# Patient Record
Sex: Female | Born: 1980 | Race: White | Hispanic: No | Marital: Married | Smoking: Never smoker
Health system: Southern US, Community
[De-identification: ages and names within clinical notes are randomized; demographics above are authoritative.]

## PROBLEM LIST (undated history)

## (undated) DIAGNOSIS — O24419 Gestational diabetes mellitus in pregnancy, unspecified control: Secondary | ICD-10-CM

## (undated) DIAGNOSIS — Z8742 Personal history of other diseases of the female genital tract: Secondary | ICD-10-CM

## (undated) DIAGNOSIS — D649 Anemia, unspecified: Secondary | ICD-10-CM

## (undated) HISTORY — DX: Personal history of other diseases of the female genital tract: Z87.42

## (undated) HISTORY — DX: Anemia, unspecified: D64.9

## (undated) HISTORY — DX: Gestational diabetes mellitus in pregnancy, unspecified control: O24.419

---

## 2007-12-28 ENCOUNTER — Emergency Department: Payer: Self-pay | Admitting: Emergency Medicine

## 2007-12-28 ENCOUNTER — Other Ambulatory Visit: Payer: Self-pay

## 2008-01-21 ENCOUNTER — Ambulatory Visit: Payer: Self-pay | Admitting: Family Medicine

## 2008-10-26 ENCOUNTER — Ambulatory Visit: Payer: Self-pay | Admitting: Family Medicine

## 2009-06-01 IMAGING — CT CT ABD-PELV W/ CM
1 of 2 series · 15 of 32 positions shown, 19 images · non-contrast
Comparison: none

REASON FOR EXAM: (1) RLQ pain; (2) RLQ pain
COMMENTS:

PROCEDURE:     CT  - CT ABDOMEN / PELVIS  W  - December 29, 2007  [DATE]
RESULT:     Comparison: No available comparison exam.
TECHNIQUE: CT examination of the abdomen and pelvis was performed after
intravenous administration of 100 ml of Nsovue-G74 nonionic contrast in
addition to oral contrast. Collimation is 8 mm. Reformats with collimation
of 2 mm were reviewed on the work station.

[Series 2: abdomen · axial · 0.68mm/px · z∈[-96,+272]mm · 15 of 52 slices shown, 19 images]
[im 3/52  soft-tissue]
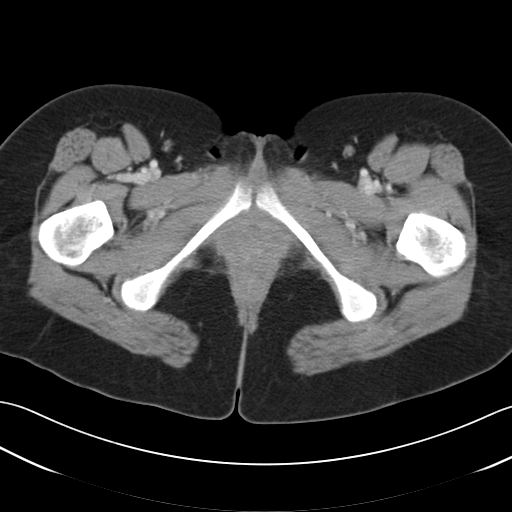
[im 3/52  bone]
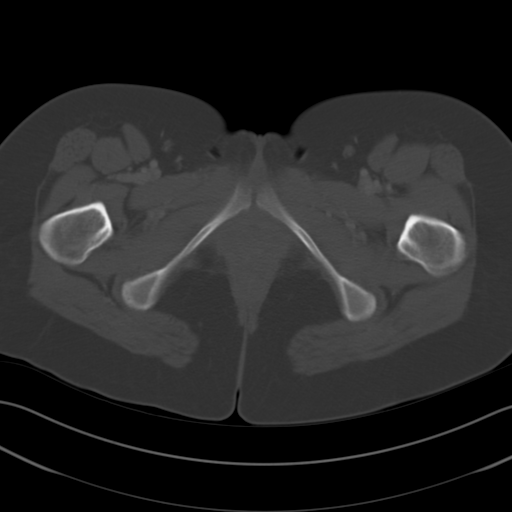
[im 7/52  soft-tissue]
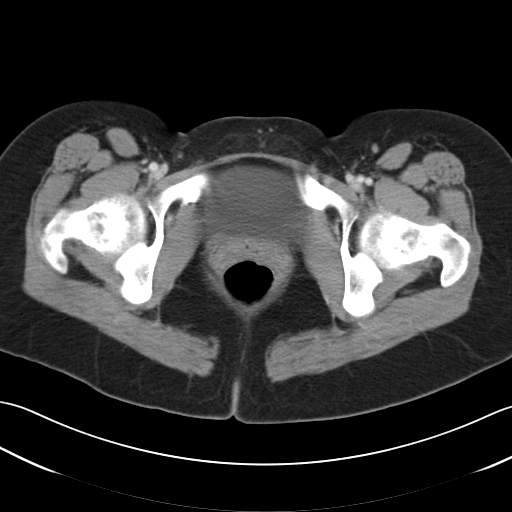
[im 11/52  soft-tissue]
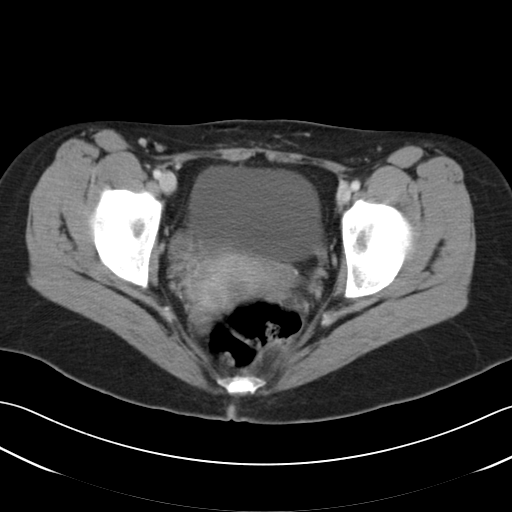
[im 15/52  soft-tissue]
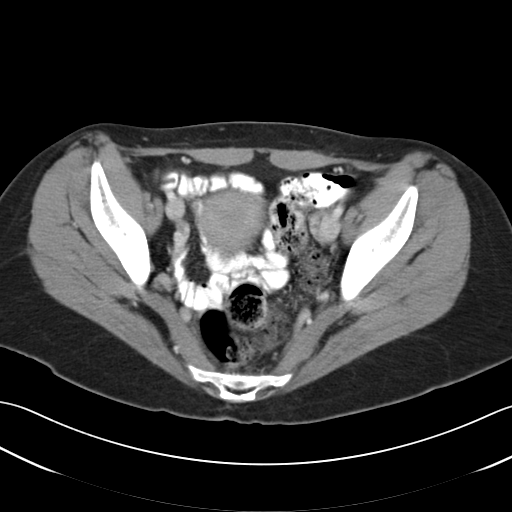
[im 18/52  soft-tissue]
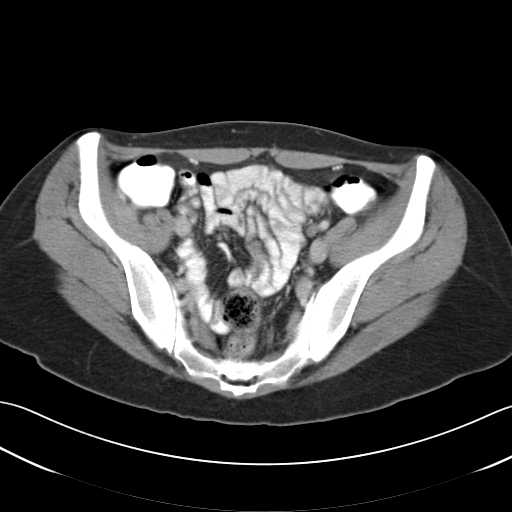
[im 22/52  soft-tissue]
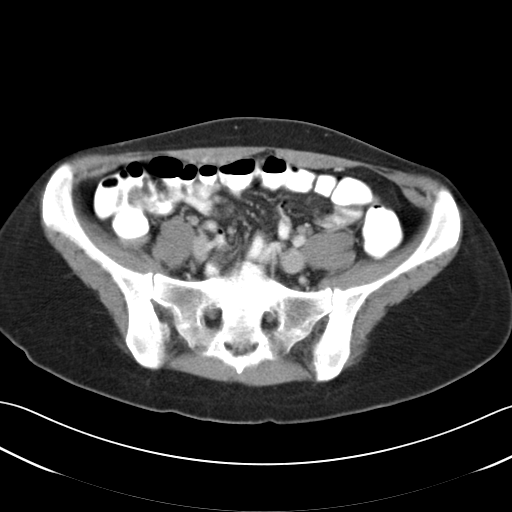
[im 26/52  soft-tissue]
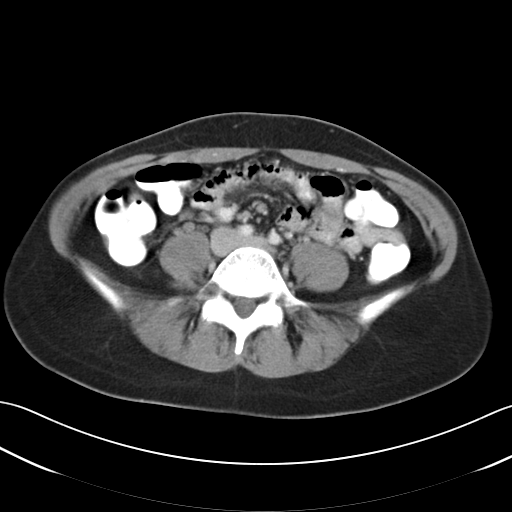
[im 30/52  soft-tissue]
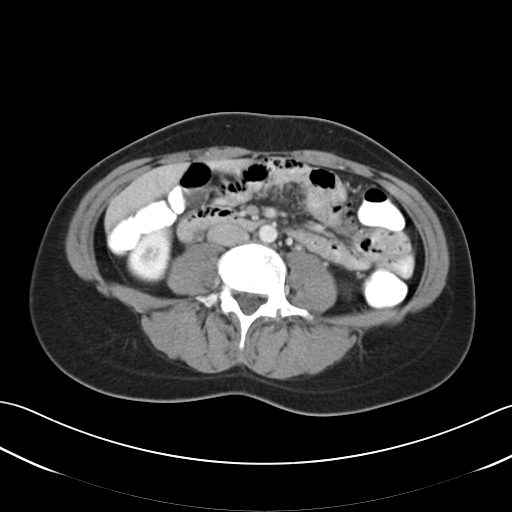
[im 35/52  soft-tissue]
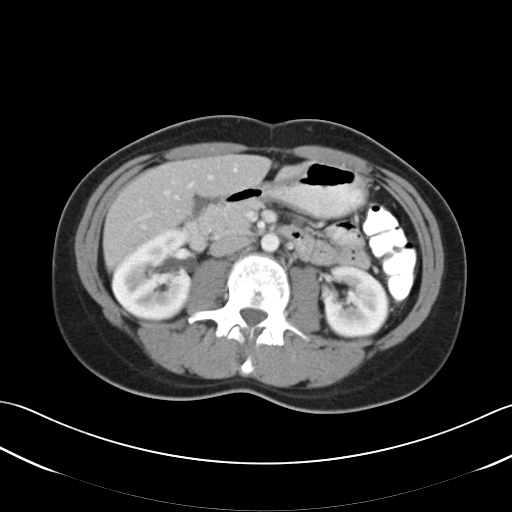
[im 35/52  bone]
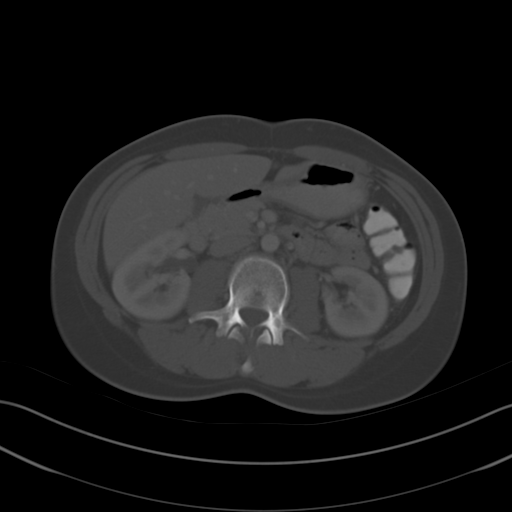
[im 37/52  soft-tissue]
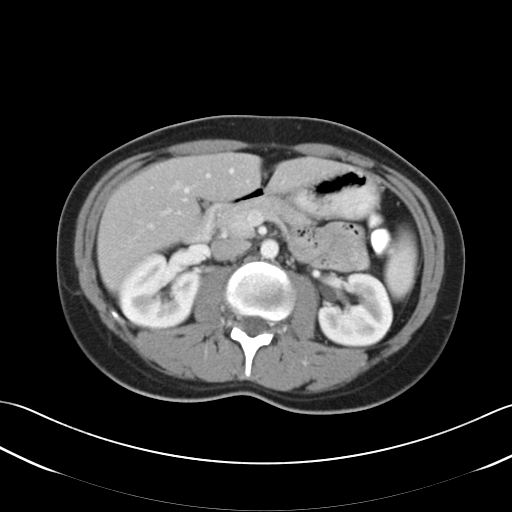
[im 41/52  soft-tissue]
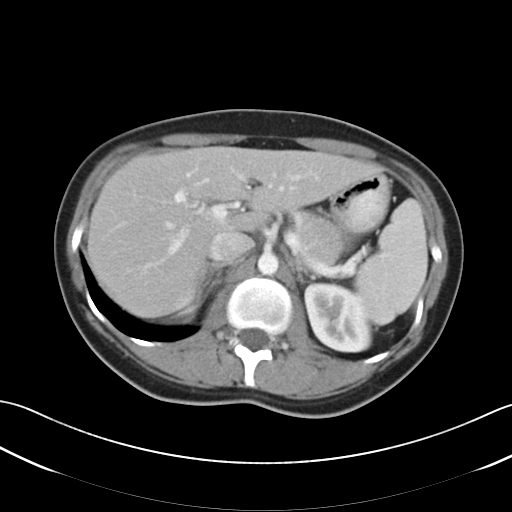
[im 43/52  lung]
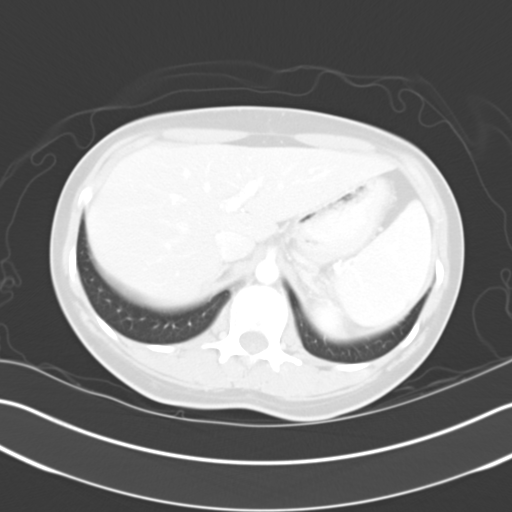
[im 45/52  soft-tissue]
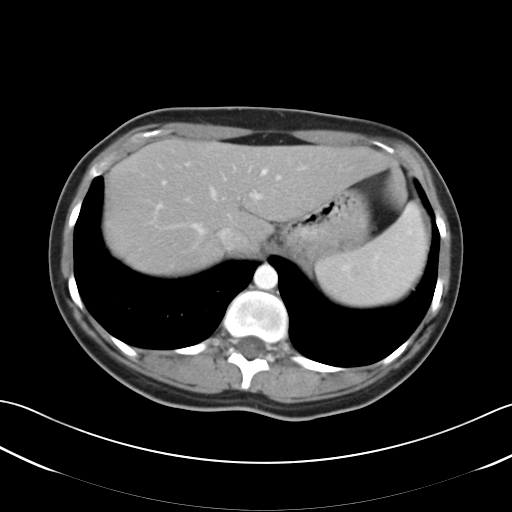
[im 45/52  lung]
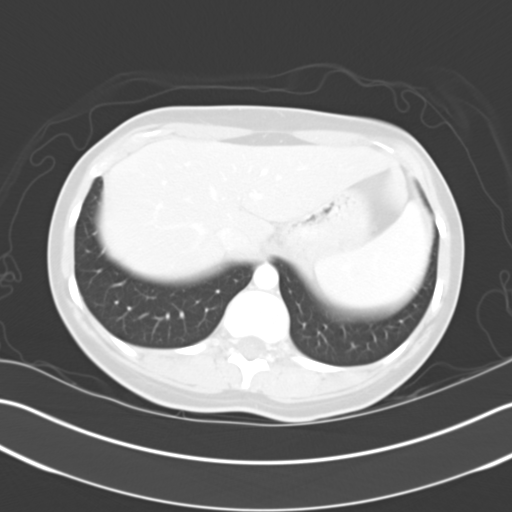
[im 47/52  lung]
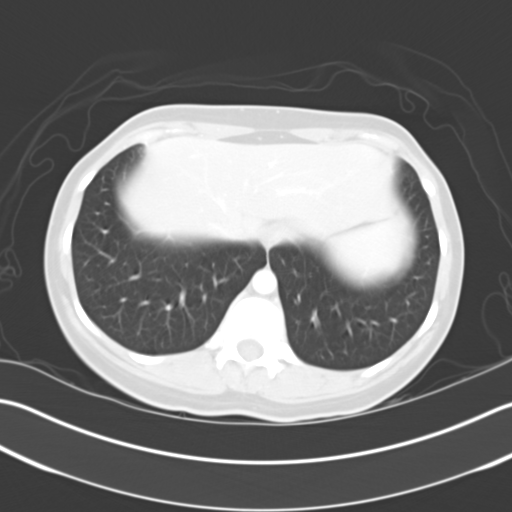
[im 49/52  soft-tissue]
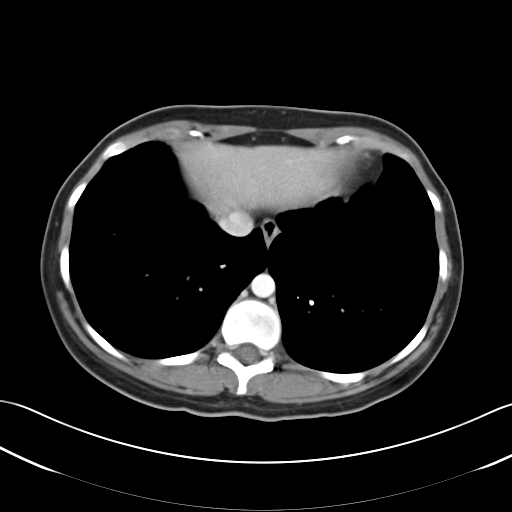
[im 49/52  lung]
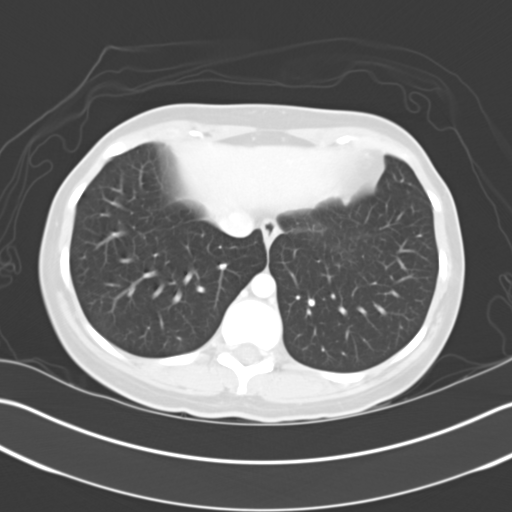

[15 of 32 positions shown; findings below may reference images not displayed]

FINDINGS: Limited evaluation of the lung bases is unremarkable.

The liver, gallbladder, spleen, pancreas, adrenal glands and kidneys are
unremarkable.

There is no dilatation or definite wall thickening of the bowels. The
appendix is unremarkable. There is no significant intra abdominal/pelvic fat
stranding. There is no intraperitoneal free air. There is no significant
free fluid. There are no enlarged abdominal pelvic lymph nodes. The uterus
and ovaries are present.
IMPRESSION: 1. The appendix is unremarkable. There is no significant intra-abdominal or
pelvic fat stranding. There is no bowel obstruction.

Preliminary report was faxed to the emergency room by the night radiologist
shortly after the study was performed.

## 2010-03-30 IMAGING — US ABDOMEN ULTRASOUND
1 series · 17 of 25 positions shown · non-contrast
Comparison: none

REASON FOR EXAM: abdominal pain
COMMENTS:

[Series 1: abdomen ultrasound · 17 of 51 slices shown]
[im 1/51]
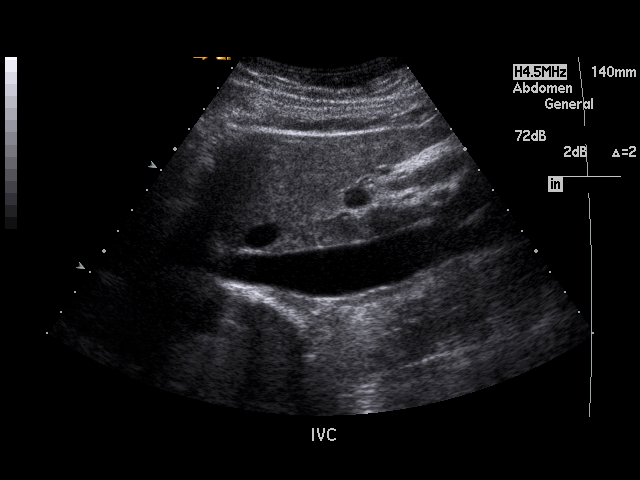
[im 5/51]
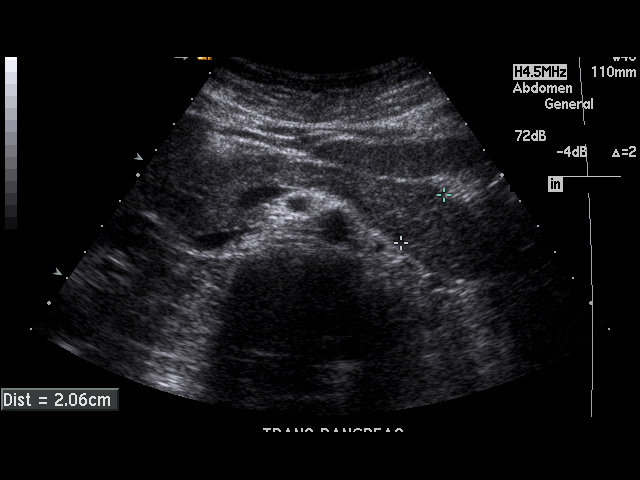
[im 7/51]
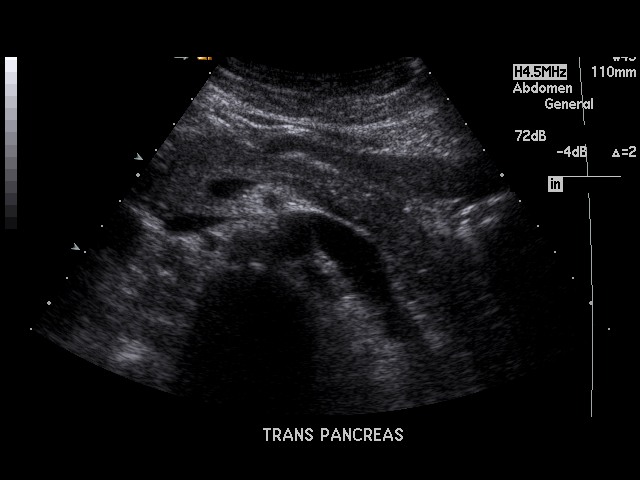
[im 11/51]
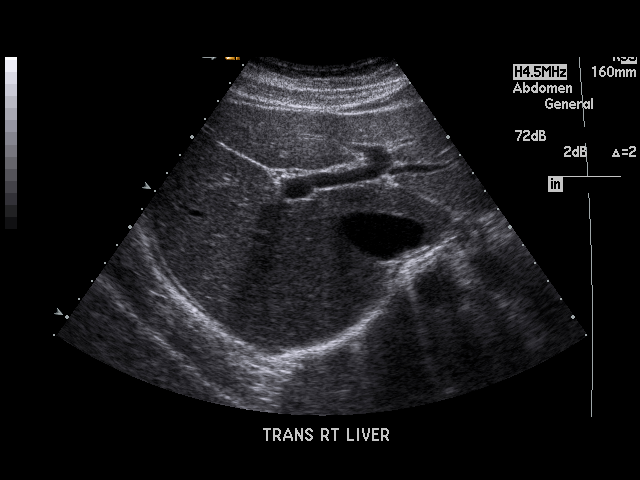
[im 13/51]
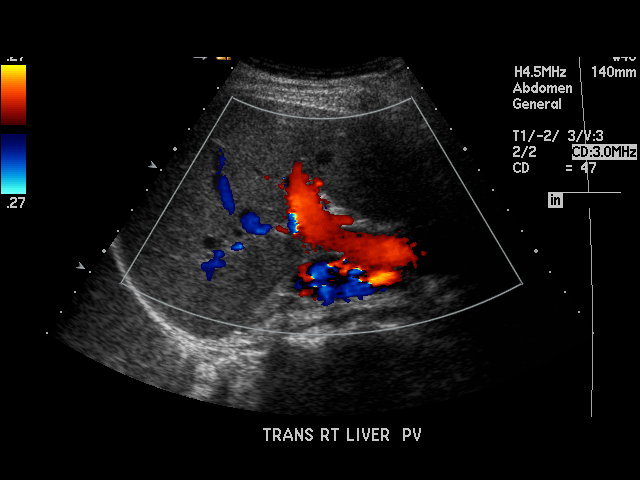
[im 17/51]
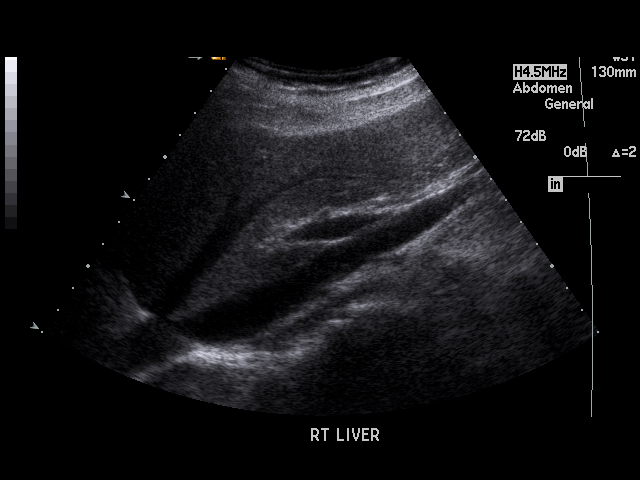
[im 19/51]
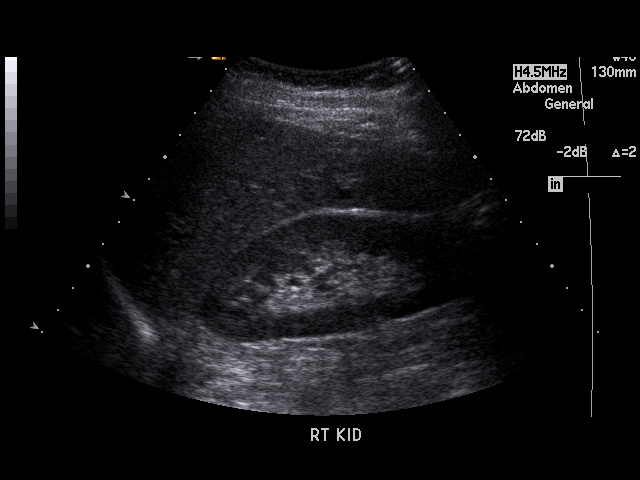
[im 23/51]
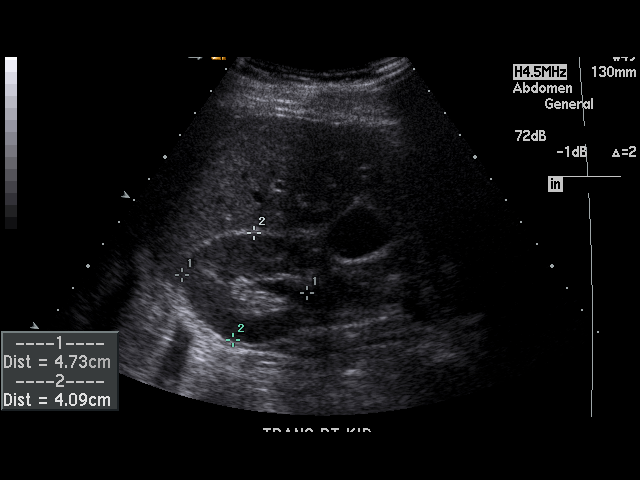
[im 26/51]
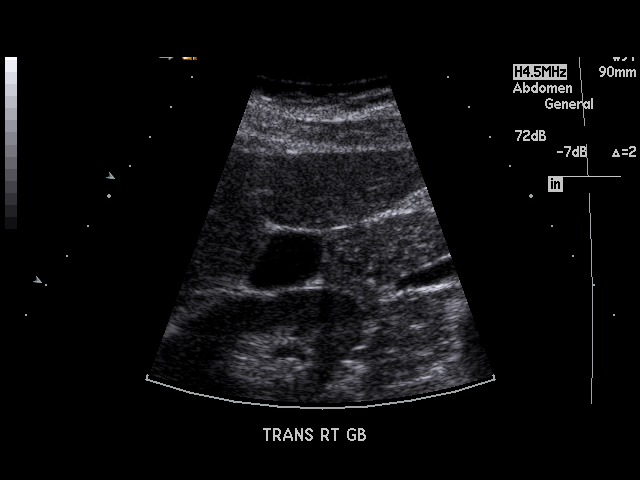
[im 28/51]
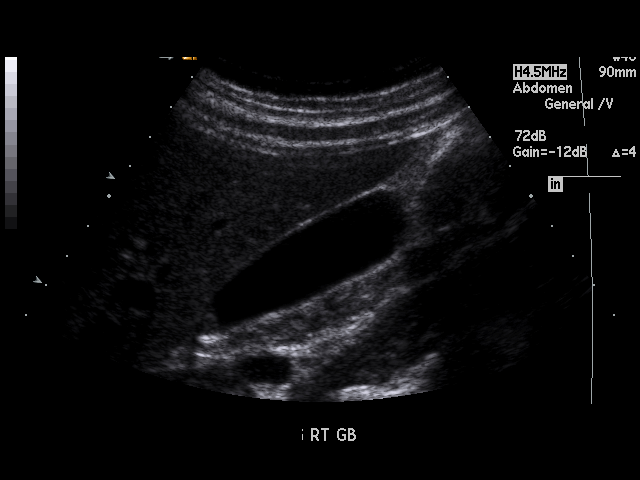
[im 32/51]
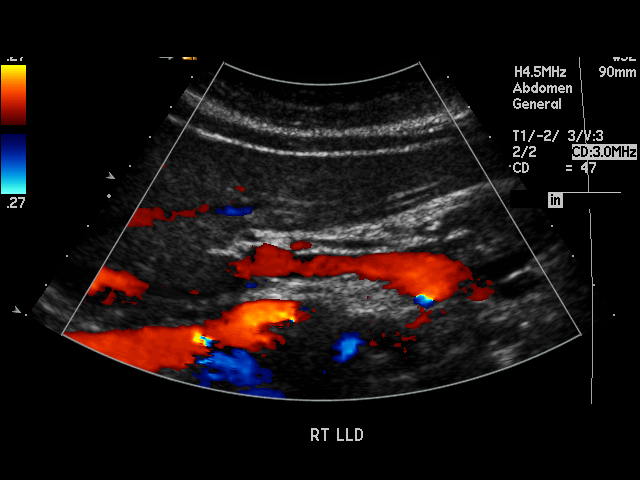
[im 34/51]
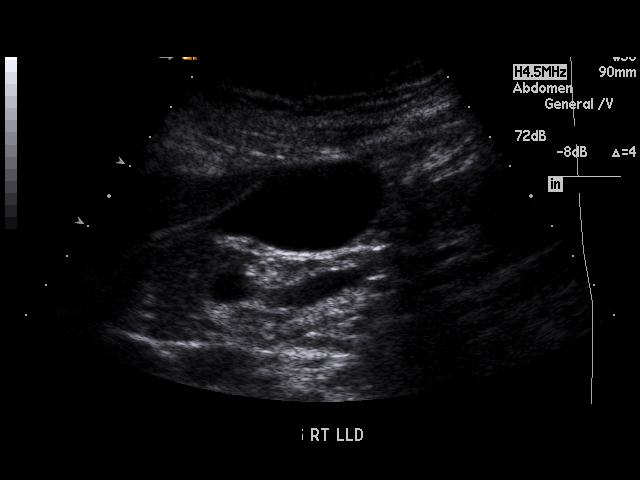
[im 38/51]
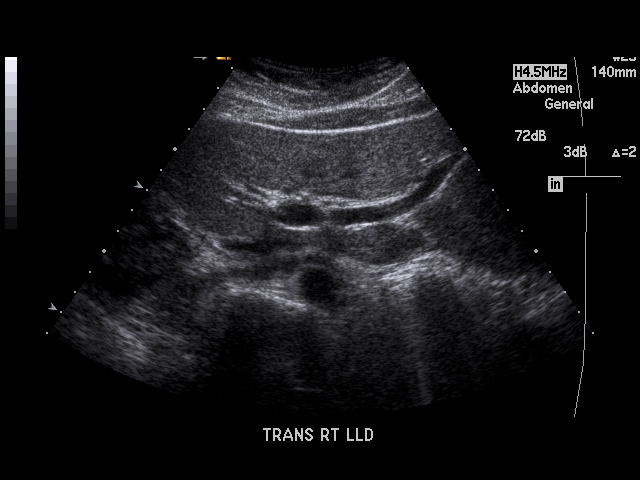
[im 40/51]
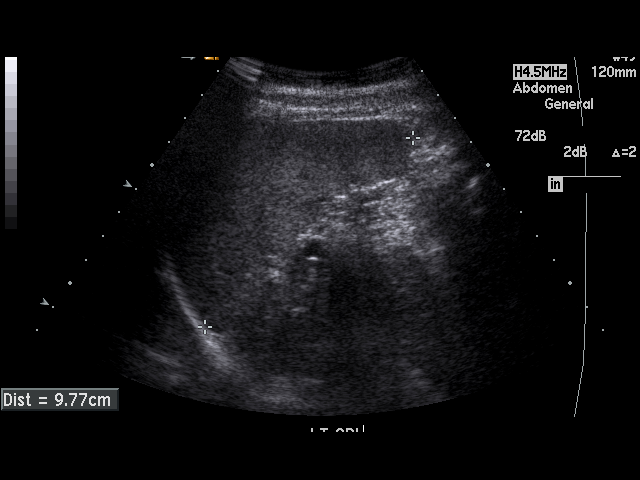
[im 44/51]
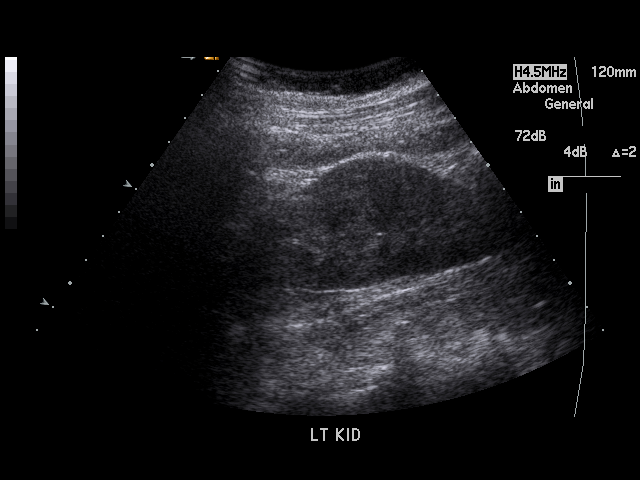
[im 46/51]
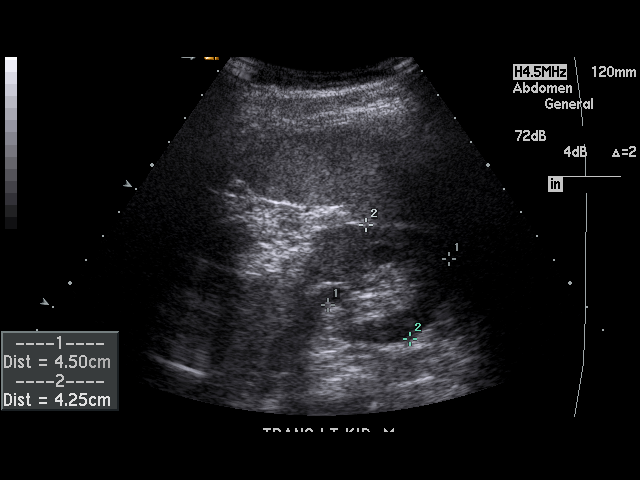
[im 51/51]
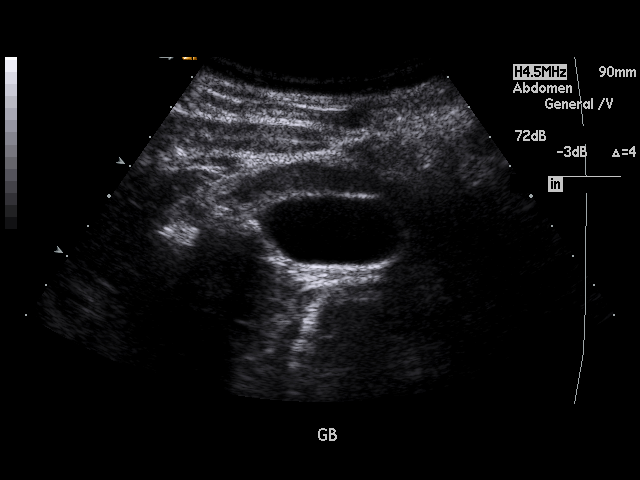

[17 of 25 positions shown; findings below may reference images not displayed]

PROCEDURE:     US  - US ABDOMEN GENERAL SURVEY  - October 26, 2008  [DATE]

RESULT:     The liver exhibits normal echotexture with no focal mass nor
ductal dilation. Portal venous flow is normal in direction toward the liver.
The gallbladder is adequately distended with no evidence of stones, wall
thickening, or pericholecystic fluid. There was no sonographic Murphy's
sign. The common bile duct measures 2.8 mm in diameter. The pancreas,
spleen, abdominal aorta, and kidneys are normal in appearance. There is no
evidence of ascites.
IMPRESSION: Normal abdominal ultrasound examination.

## 2016-07-08 ENCOUNTER — Ambulatory Visit (INDEPENDENT_AMBULATORY_CARE_PROVIDER_SITE_OTHER): Payer: Self-pay | Admitting: Obstetrics and Gynecology

## 2016-07-08 VITALS — BP 117/81 | HR 64 | Ht 65.0 in | Wt 154.4 lb

## 2016-07-08 DIAGNOSIS — Z113 Encounter for screening for infections with a predominantly sexual mode of transmission: Secondary | ICD-10-CM

## 2016-07-08 DIAGNOSIS — O09891 Supervision of other high risk pregnancies, first trimester: Secondary | ICD-10-CM

## 2016-07-08 DIAGNOSIS — Z331 Pregnant state, incidental: Secondary | ICD-10-CM

## 2016-07-08 DIAGNOSIS — J349 Unspecified disorder of nose and nasal sinuses: Secondary | ICD-10-CM | POA: Insufficient documentation

## 2016-07-08 DIAGNOSIS — Z1389 Encounter for screening for other disorder: Secondary | ICD-10-CM

## 2016-07-08 DIAGNOSIS — Z369 Encounter for antenatal screening, unspecified: Secondary | ICD-10-CM

## 2016-07-08 NOTE — Patient Instructions (Signed)
Pregnancy and Zika Virus Disease Zika virus disease, or Zika, is an illness that can spread to people from mosquitoes that carry the virus. It may also spread from person to person through infected body fluids. Zika first occurred in [place], but recently it has spread to new areas. The virus occurs in tropical climates. The location of Zika continues to change. Most people who become infected with Zika virus do not develop serious illness. However, Zika may cause birth defects in an unborn baby whose mother is infected with the virus. It may also increase the risk of miscarriage. WHAT ARE THE SYMPTOMS OF ZIKA VIRUS DISEASE? In many cases, people who have been infected with Zika virus do not develop any symptoms. If symptoms appear, they usually start about a week after the person is infected. Symptoms are usually mild. They may include:  Fever.  Rash.  Red eyes.  Joint pain. HOW DOES ZIKA VIRUS DISEASE SPREAD? The main way that Zika virus spreads is through the bite of a certain type of mosquito. Unlike most types of mosquitos, which bite only at night, the type of mosquito that carries Zika virus bites both at night and during the day. Zika virus can also spread through sexual contact, through a blood transfusion, and from a mother to her baby before or during birth. Once you have had Zika virus disease, it is unlikely that you will get it again. CAN I PASS ZIKA TO MY BABY DURING PREGNANCY? Yes, Zika can pass from a mother to her baby before or during birth. WHAT PROBLEMS CAN ZIKA CAUSE FOR MY BABY? A woman who is infected with Zika virus while pregnant is at risk of having her baby born with a condition in which the brain or head is smaller than expected (microcephaly). Babies who have microcephaly can have developmental delays, seizures, hearing problems, and vision problems. Having Zika virus disease during pregnancy can also increase the risk of miscarriage. HOW CAN ZIKA VIRUS DISEASE BE  PREVENTED? There is no vaccine to prevent Zika. The best way to prevent the disease is to avoid infected mosquitoes and avoid exposure to body fluids that can spread the virus. Avoid any possible exposure to Zika by taking the following precautions. For women and their sex partners:  Avoid traveling to high-risk areas. The locations where Zika is being reported change often. To identify high-risk areas, check the CDC travel website: www.cdc.gov/zika/geo/index.html  If you or your sex partner must travel to a high-risk area, talk with a health care provider before and after traveling.  Take all precautions to avoid mosquito bites if you live in, or travel to, any of the high-risk areas. Insect repellents are safe to use during pregnancy.  Ask your health care provider when it is safe to have sexual contact. For women:  If you are pregnant or trying to become pregnant, avoid sexual contact with persons who may have been exposed to Zika virus, persons who have possible symptoms of Zika, or persons whose history you are unsure about. If you choose to have sexual contact with someone who may have been exposed to Zika virus, use condoms correctly during the entire duration of sexual activity, every time. Do not share sexual devices, as you may be exposed to body fluids.  Ask your health care provider about when it is safe to attempt pregnancy after a possible exposure to Zika virus. WHAT STEPS SHOULD I TAKE TO AVOID MOSQUITO BITES? Take these steps to avoid mosquito bites when you are   in a high-risk area:  Wear loose clothing that covers your arms and legs.  Limit your outdoor activities.  Do not open windows unless they have window screens.  Sleep under mosquito nets.  Use insect repellent. The best insect repellents have:  DEET, picaridin, oil of lemon eucalyptus (OLE), or IR3535 in them.  Higher amounts of an active ingredient in them.  Remember that insect repellents are safe to use  during pregnancy.  Do not use OLE on children who are younger than 3 years of age. Do not use insect repellent on babies who are younger than 2 months of age.  Cover your child's stroller with mosquito netting. Make sure the netting fits snugly and that any loose netting does not cover your child's mouth or nose. Do not use a blanket as a mosquito-protection cover.  Do not apply insect repellent underneath clothing.  If you are using sunscreen, apply the sunscreen before applying the insect repellent.  Treat clothing with permethrin. Do not apply permethrin directly to your skin. Follow label directions for safe use.  Get rid of standing water, where mosquitoes may reproduce. Standing water is often found in items such as buckets, bowls, animal food dishes, and flowerpots. When you return from traveling to any high-risk area, continue taking actions to protect yourself against mosquito bites for 3 weeks, even if you show no signs of illness. This will prevent spreading Zika virus to uninfected mosquitoes. WHAT SHOULD I KNOW ABOUT THE SEXUAL TRANSMISSION OF ZIKA? People can spread Zika to their sexual partners during vaginal, anal, or oral sex, or by sharing sexual devices. Many people with Zika do not develop symptoms, so a person could spread the disease without knowing that they are infected. The greatest risk is to women who are pregnant or who may become pregnant. Zika virus can live longer in semen than it can live in blood. Couples can prevent sexual transmission of the virus by:  Using condoms correctly during the entire duration of sexual activity, every time. This includes vaginal, anal, and oral sex.  Not sharing sexual devices. Sharing increases your risk of being exposed to body fluid from another person.  Avoiding all sexual activity until your health care provider says it is safe. SHOULD I BE TESTED FOR ZIKA VIRUS? A sample of your blood can be tested for Zika virus. A pregnant  woman should be tested if she may have been exposed to the virus or if she has symptoms of Zika. She may also have additional tests done during her pregnancy, such ultrasound testing. Talk with your health care provider about which tests are recommended.   This information is not intended to replace advice given to you by your health care provider. Make sure you discuss any questions you have with your health care provider.   Document Released: 06/07/2015 Document Reviewed: 05/31/2015 Elsevier Interactive Patient Education 2016 [employer] Minor Illnesses and Medications in Pregnancy  Cold/Flu:  Sudafed for congestion- Robitussin (plain) for cough- Tylenol for discomfort.  Please follow the directions on the label.  Try not to take any more than needed.  OTC Saline nasal spray and air humidifier or cool-mist  Vaporizer to sooth nasal irritation and to loosen congestion.  It is also important to increase intake of non carbonated fluids, especially if you have a fever.  Constipation:  Colace-2 capsules at bedtime; Metamucil- follow directions on label; Senokot- 1 tablet at bedtime.  Any one of these medications can be used.  It is also   very important to increase fluids and fruits along with regular exercise.  If problem persists please call the office.  Diarrhea:  Kaopectate as directed on the label.  Eat a bland diet and increase fluids.  Avoid highly seasoned foods.  Headache:  Tylenol 1 or 2 tablets every 3-4 hours as needed  Indigestion:  Maalox, Mylanta, Tums or Rolaids- as directed on label.  Also try to eat small meals and avoid fatty, greasy or spicy foods.  Nausea with or without Vomiting:  Nausea in pregnancy is caused by increased levels of hormones in the body which influence the digestive system and cause irritation when stomach acids accumulate.  Symptoms usually subside after 1st trimester of pregnancy.  Try the following: 1. Keep saltines, graham crackers or dry toast by your bed  to eat upon awakening. 2. Don't let your stomach get empty.  Try to eat 5-6 small meals per day instead of 3 large ones. 3. Avoid greasy fatty or highly seasoned foods.  4. Take OTC Unisom 1 tablet at bed time along with OTC Vitamin B6 25-50 mg 3 times per day.    If nausea continues with vomiting and you are unable to keep down food and fluids you may need a prescription medication.  Please notify your provider.   Sore throat:  Chloraseptic spray, throat lozenges and or plain Tylenol.  Vaginal Yeast Infection:  OTC Monistat for 7 days as directed on label.  If symptoms do not resolve within a week notify provider.  If any of the above problems do not subside with recommended treatment please call the office for further assistance.   Do not take Aspirin, Advil, Motrin or Ibuprofen.  * * OTC= Over the counter Hyperemesis Gravidarum Hyperemesis gravidarum is a severe form of nausea and vomiting that happens during pregnancy. Hyperemesis is worse than morning sickness. It may cause you to have nausea or vomiting all day for many days. It may keep you from eating and drinking enough food and liquids. Hyperemesis usually occurs during the first half (the first 20 weeks) of pregnancy. It often goes away once a woman is in her second half of pregnancy. However, sometimes hyperemesis continues through an entire pregnancy.  CAUSES  The cause of this condition is not completely known but is thought to be related to changes in the body's hormones when pregnant. It could be from the high level of the pregnancy hormone or an increase in estrogen in the body.  SIGNS AND SYMPTOMS   Severe nausea and vomiting.  Nausea that does not go away.  Vomiting that does not allow you to keep any food down.  Weight loss and body fluid loss (dehydration).  Having no desire to eat or not liking food you have previously enjoyed. DIAGNOSIS  Your health care provider will do a physical exam and ask you about your  symptoms. He or she may also order blood tests and urine tests to make sure something else is not causing the problem.  TREATMENT  You may only need medicine to control the problem. If medicines do not control the nausea and vomiting, you will be treated in the hospital to prevent dehydration, increased acid in the blood (acidosis), weight loss, and changes in the electrolytes in your body that may harm the unborn baby (fetus). You may need IV fluids.  HOME CARE INSTRUCTIONS   Only take over-the-counter or prescription medicines as directed by your health care provider.  Try eating a couple of dry crackers or   toast in the morning before getting out of bed.  Avoid foods and smells that upset your stomach.  Avoid fatty and spicy foods.  Eat 5-6 small meals a day.  Do not drink when eating meals. Drink between meals.  For snacks, eat high-protein foods, such as cheese.  Eat or suck on things that have ginger in them. Ginger helps nausea.  Avoid food preparation. The smell of food can spoil your appetite.  Avoid iron pills and iron in your multivitamins until after 3-4 months of being pregnant. However, consult with your health care provider before stopping any prescribed iron pills. SEEK MEDICAL CARE IF:   Your abdominal pain increases.  You have a severe headache.  You have vision problems.  You are losing weight. SEEK IMMEDIATE MEDICAL CARE IF:   You are unable to keep fluids down.  You vomit blood.  You have constant nausea and vomiting.  You have excessive weakness.  You have extreme thirst.  You have dizziness or fainting.  You have a fever or persistent symptoms for more than 2-3 days.  You have a fever and your symptoms suddenly get worse. MAKE SURE YOU:   Understand these instructions.  Will watch your condition.  Will get help right away if you are not doing well or get worse.   This information is not intended to replace advice given to you by your  health care provider. Make sure you discuss any questions you have with your health care provider.   Document Released: 09/16/2005 Document Revised: 07/07/2013 Document Reviewed: 04/28/2013 Elsevier Interactive Patient Education 2016 [employer] Commonly Asked Questions During Pregnancy  Cats: A parasite can be excreted in cat feces.  To avoid exposure you need to have another person empty the little box.  If you must empty the litter box you will need to wear gloves.  Wash your hands after handling your cat.  This parasite can also be found in raw or undercooked meat so this should also be avoided.  Colds, Sore Throats, Flu: Please check your medication sheet to see what you can take for symptoms.  If your symptoms are unrelieved by these medications please call the office.  Dental Work: Most any dental work your dentist recommends is permitted.  X-rays should only be taken during the first trimester if absolutely necessary.  Your abdomen should be shielded with a lead apron during all x-rays.  Please notify your provider prior to receiving any x-rays.  Novocaine is fine; gas is not recommended.  If your dentist requires a note from us prior to dental work please call the office and we will provide one for you.  Exercise: Exercise is an important part of staying healthy during your pregnancy.  You may continue most exercises you were accustomed to prior to pregnancy.  Later in your pregnancy you will most likely notice you have difficulty with activities requiring balance like riding a bicycle.  It is important that you listen to your body and avoid activities that put you at a higher risk of falling.  Adequate rest and staying well hydrated are a must!  If you have questions about the safety of specific activities ask your provider.    Exposure to Children with illness: Try to avoid obvious exposure; report any symptoms to us when noted,  If you have chicken pos, red measles or mumps, you should  be immune to these diseases.   Please do not take any vaccines while pregnant unless you have checked with   your OB provider.  Fetal Movement: After 28 weeks we recommend you do "kick counts" twice daily.  Lie or sit down in a calm quiet environment and count your baby movements "kicks".  You should feel your baby at least 10 times per hour.  If you have not felt 10 kicks within the first hour get up, walk around and have something sweet to eat or drink then repeat for an additional hour.  If count remains less than 10 per hour notify your provider.  Fumigating: Follow your pest control agent's advice as to how long to stay out of your home.  Ventilate the area well before re-entering.  Hemorrhoids:   Most over-the-counter preparations can be used during pregnancy.  Check your medication to see what is safe to use.  It is important to use a stool softener or fiber in your diet and to drink lots of liquids.  If hemorrhoids seem to be getting worse please call the office.   Hot Tubs:  Hot tubs Jacuzzis and saunas are not recommended while pregnant.  These increase your internal body temperature and should be avoided.  Intercourse:  Sexual intercourse is safe during pregnancy as long as you are comfortable, unless otherwise advised by your provider.  Spotting may occur after intercourse; report any bright red bleeding that is heavier than spotting.  Labor:  If you know that you are in labor, please go to the hospital.  If you are unsure, please call the office and let us help you decide what to do.  Lifting, straining, etc:  If your job requires heavy lifting or straining please check with your provider for any limitations.  Generally, you should not lift items heavier than that you can lift simply with your hands and arms (no back muscles)  Painting:  Paint fumes do not harm your pregnancy, but may make you ill and should be avoided if possible.  Latex or water based paints have less odor than oils.   Use adequate ventilation while painting.  Permanents & Hair Color:  Chemicals in hair dyes are not recommended as they cause increase hair dryness which can increase hair loss during pregnancy.  " Highlighting" and permanents are allowed.  Dye may be absorbed differently and permanents may not hold as well during pregnancy.  Sunbathing:  Use a sunscreen, as skin burns easily during pregnancy.  Drink plenty of fluids; avoid over heating.  Tanning Beds:  Because their possible side effects are still unknown, tanning beds are not recommended.  Ultrasound Scans:  Routine ultrasounds are performed at approximately 20 weeks.  You will be able to see your baby's general anatomy an if you would like to know the gender this can usually be determined as well.  If it is questionable when you conceived you may also receive an ultrasound early in your pregnancy for dating purposes.  Otherwise ultrasound exams are not routinely performed unless there is a medical necessity.  Although you can request a scan we ask that you pay for it when conducted because insurance does not cover " patient request" scans.  Work: If your pregnancy proceeds without complications you may work until your due date, unless your physician or employer advises otherwise.  Round Ligament Pain/Pelvic Discomfort:  Sharp, shooting pains not associated with bleeding are fairly common, usually occurring in the second trimester of pregnancy.  They tend to be worse when standing up or when you remain standing for long periods of time.  These are the result   of pressure of certain pelvic ligaments called "round ligaments".  Rest, Tylenol and heat seem to be the most effective relief.  As the womb and fetus grow, they rise out of the pelvis and the discomfort improves.  Please notify the office if your pain seems different than that described.  It may represent a more serious condition.   

## 2016-07-08 NOTE — Progress Notes (Signed)
Clovis FredricksonShana S Eischeid presents for NOB nurse interview visit. Pregnancy confirmation done at [facility] on 07/01/2016. LMP: 05/13/2016.  G-2 .  P-1001 . Pt had twin males in 2004 and also has history of twins on both sides of family. Ultrasound ordered for viability and dating.  Pregnancy education material explained and given.  No cats in the home. NOB labs ordered. HIV labs and Drug screen were explained optional and she did not decline. Drug screen ordered. PNV encouraged. Genetic screening, to discuss with provider. Pt. To follow up with provider in 4 weeks for NOB physical.  All questions answered.

## 2016-07-09 ENCOUNTER — Encounter: Payer: Self-pay | Admitting: Obstetrics and Gynecology

## 2016-07-09 ENCOUNTER — Ambulatory Visit (INDEPENDENT_AMBULATORY_CARE_PROVIDER_SITE_OTHER): Payer: Medicaid Other

## 2016-07-09 DIAGNOSIS — Z283 Underimmunization status: Secondary | ICD-10-CM | POA: Insufficient documentation

## 2016-07-09 DIAGNOSIS — Z3481 Encounter for supervision of other normal pregnancy, first trimester: Secondary | ICD-10-CM

## 2016-07-09 DIAGNOSIS — O09891 Supervision of other high risk pregnancies, first trimester: Secondary | ICD-10-CM

## 2016-07-09 DIAGNOSIS — O09899 Supervision of other high risk pregnancies, unspecified trimester: Secondary | ICD-10-CM | POA: Insufficient documentation

## 2016-07-09 DIAGNOSIS — Z369 Encounter for antenatal screening, unspecified: Secondary | ICD-10-CM

## 2016-07-09 DIAGNOSIS — O9989 Other specified diseases and conditions complicating pregnancy, childbirth and the puerperium: Secondary | ICD-10-CM

## 2016-07-09 LAB — URINALYSIS, ROUTINE W REFLEX MICROSCOPIC
BILIRUBIN UA: NEGATIVE
GLUCOSE, UA: NEGATIVE
KETONES UA: NEGATIVE
NITRITE UA: NEGATIVE
Protein, UA: NEGATIVE
RBC UA: NEGATIVE
SPEC GRAV UA: 1.014 (ref 1.005–1.030)
Urobilinogen, Ur: 0.2 mg/dL (ref 0.2–1.0)
pH, UA: 7.5 (ref 5.0–7.5)

## 2016-07-09 LAB — RUBELLA SCREEN

## 2016-07-09 LAB — MICROSCOPIC EXAMINATION: Casts: NONE SEEN /lpf

## 2016-07-09 LAB — MONITOR DRUG PROFILE 14(MW)
Amphetamine Scrn, Ur: NEGATIVE ng/mL
BARBITURATE SCREEN URINE: NEGATIVE ng/mL
BENZODIAZEPINE SCREEN, URINE: NEGATIVE ng/mL
Buprenorphine, Urine: NEGATIVE ng/mL
CANNABINOIDS UR QL SCN: NEGATIVE ng/mL
Cocaine (Metab) Scrn, Ur: NEGATIVE ng/mL
Creatinine(Crt), U: 71.7 mg/dL (ref 20.0–300.0)
Fentanyl, Urine: NEGATIVE pg/mL
Meperidine Screen, Urine: NEGATIVE ng/mL
Methadone Screen, Urine: NEGATIVE ng/mL
OXYCODONE+OXYMORPHONE UR QL SCN: NEGATIVE ng/mL
Opiate Scrn, Ur: NEGATIVE ng/mL
Ph of Urine: 7.1 (ref 4.5–8.9)
Phencyclidine Qn, Ur: NEGATIVE ng/mL
Propoxyphene Scrn, Ur: NEGATIVE ng/mL
SPECIFIC GRAVITY: 1.016
Tramadol Screen, Urine: NEGATIVE ng/mL

## 2016-07-09 LAB — ABO

## 2016-07-09 LAB — CBC WITH DIFFERENTIAL/PLATELET
BASOS: 0 %
Basophils Absolute: 0 10*3/uL (ref 0.0–0.2)
EOS (ABSOLUTE): 0.1 10*3/uL (ref 0.0–0.4)
EOS: 2 %
Hematocrit: 39.2 % (ref 34.0–46.6)
Hemoglobin: 13.1 g/dL (ref 11.1–15.9)
IMMATURE GRANS (ABS): 0 10*3/uL (ref 0.0–0.1)
IMMATURE GRANULOCYTES: 0 %
LYMPHS: 24 %
Lymphocytes Absolute: 1.9 10*3/uL (ref 0.7–3.1)
MCH: 28.8 pg (ref 26.6–33.0)
MCHC: 33.4 g/dL (ref 31.5–35.7)
MCV: 86 fL (ref 79–97)
MONOCYTES: 6 %
Monocytes Absolute: 0.5 10*3/uL (ref 0.1–0.9)
NEUTROS PCT: 68 %
Neutrophils Absolute: 5.5 10*3/uL (ref 1.4–7.0)
Platelets: 301 10*3/uL (ref 150–379)
RBC: 4.55 x10E6/uL (ref 3.77–5.28)
RDW: 13.7 % (ref 12.3–15.4)
WBC: 8.1 10*3/uL (ref 3.4–10.8)

## 2016-07-09 LAB — HEPATITIS B SURFACE ANTIGEN: Hepatitis B Surface Ag: NEGATIVE

## 2016-07-09 LAB — RH TYPE: Rh Factor: POSITIVE

## 2016-07-09 LAB — ANTIBODY SCREEN: Antibody Screen: NEGATIVE

## 2016-07-09 LAB — NICOTINE SCREEN, URINE: Cotinine Ql Scrn, Ur: NEGATIVE ng/mL

## 2016-07-09 LAB — HIV ANTIBODY (ROUTINE TESTING W REFLEX): HIV Screen 4th Generation wRfx: NONREACTIVE

## 2016-07-09 LAB — RPR: RPR Ser Ql: NONREACTIVE

## 2016-07-09 LAB — VARICELLA ZOSTER ANTIBODY, IGG: Varicella zoster IgG: 1339 index (ref >165)

## 2016-07-10 LAB — GC/CHLAMYDIA PROBE AMP
CHLAMYDIA, DNA PROBE: NEGATIVE
Neisseria gonorrhoeae by PCR: NEGATIVE

## 2016-07-10 LAB — URINE CULTURE, OB REFLEX: Organism ID, Bacteria: NO GROWTH

## 2016-07-10 LAB — CULTURE, OB URINE

## 2016-07-26 ENCOUNTER — Encounter: Payer: Self-pay | Admitting: Obstetrics and Gynecology

## 2016-08-06 ENCOUNTER — Encounter: Payer: Self-pay | Admitting: Obstetrics and Gynecology

## 2016-08-13 ENCOUNTER — Encounter: Payer: Self-pay | Admitting: Obstetrics and Gynecology

## 2016-08-13 ENCOUNTER — Ambulatory Visit (INDEPENDENT_AMBULATORY_CARE_PROVIDER_SITE_OTHER): Payer: Medicaid Other | Admitting: Obstetrics and Gynecology

## 2016-08-13 ENCOUNTER — Ambulatory Visit (INDEPENDENT_AMBULATORY_CARE_PROVIDER_SITE_OTHER): Payer: Medicaid Other

## 2016-08-13 VITALS — BP 105/68 | HR 81 | Wt 157.7 lb

## 2016-08-13 DIAGNOSIS — O0992 Supervision of high risk pregnancy, unspecified, second trimester: Secondary | ICD-10-CM

## 2016-08-13 DIAGNOSIS — Z1379 Encounter for other screening for genetic and chromosomal anomalies: Secondary | ICD-10-CM

## 2016-08-13 DIAGNOSIS — N83201 Unspecified ovarian cyst, right side: Secondary | ICD-10-CM | POA: Insufficient documentation

## 2016-08-13 DIAGNOSIS — O09212 Supervision of pregnancy with history of pre-term labor, second trimester: Secondary | ICD-10-CM

## 2016-08-13 DIAGNOSIS — N83202 Unspecified ovarian cyst, left side: Secondary | ICD-10-CM

## 2016-08-13 DIAGNOSIS — E663 Overweight: Secondary | ICD-10-CM

## 2016-08-13 DIAGNOSIS — O09892 Supervision of other high risk pregnancies, second trimester: Secondary | ICD-10-CM

## 2016-08-13 DIAGNOSIS — O34219 Maternal care for unspecified type scar from previous cesarean delivery: Secondary | ICD-10-CM

## 2016-08-13 LAB — POCT URINALYSIS DIPSTICK
Bilirubin, UA: NEGATIVE
GLUCOSE UA: NEGATIVE
Ketones, UA: NEGATIVE
NITRITE UA: NEGATIVE
Protein, UA: NEGATIVE
Spec Grav, UA: 1.025
UROBILINOGEN UA: NEGATIVE
pH, UA: 7

## 2016-08-13 NOTE — Progress Notes (Signed)
OBSTETRIC INITIAL PRENATAL VISIT  Subjective:    Alyssa Mcconnell is being seen today for her first obstetrical visit.  This is not a planned pregnancy. She is a [id number] female at 4971w1d gestation, Estimated Date of Delivery: 02/17/17 with Patient's last menstrual period was 05/13/2016 (exact date), consistent with 8 week sono. Her obstetrical history is significant for preterm twin delivery at 7832 weeks gestation and h/o prior C-section x 1.  Relationship with FOB: spouse, living together. Patient does intend to breast feed. Pregnancy history fully reviewed.    Obstetric History   G2   P1   T0   P1   A0   L1    SAB0   TAB0   Ectopic0   Multiple0   Live Births1     # Outcome Date GA Lbr Len/2nd Weight Sex Delivery Anes PTL Lv  2 Current           1 Preterm 01/31/03 7682w0d  3 lb (1.361 kg) M CS-Unspec  Y LIV    Obstetric Comments  2004 [facility] (emergency C/S) weight 3lbs, and 4lbs.    Gynecologic History:  Last pap smear was 2 years ago.  Results were normal.  Denies h/o abnormal pap smears in the past.  Denies history of STIs. Contraception: None   Past Medical History:  Diagnosis Date  . Anemia    during pregnancy  . History of ovarian cyst     Family History  Problem Relation Age of Onset  . Diabetes Father     Past Surgical History:  Procedure Laterality Date  . CESAREAN SECTION      Social History   Social History  . Marital status: Married    Spouse name: N/A  . Number of children: N/A  . Years of education: N/A   Occupational History  . Not on file.   Social History Main Topics  . Smoking status: Never Smoker  . Smokeless tobacco: Never Used  . Alcohol use No  . Drug use: No  . Sexual activity: Yes   Other Topics Concern  . Not on file   Social History Narrative  . No narrative on file    Current Outpatient Prescriptions on File Prior to Visit  Medication Sig Dispense Refill  . Prenatal Vit-Fe Fumarate-FA (PRENATAL VITAMINS) 28-0.8 MG TABS  Take by mouth.     No current facility-administered medications on file prior to visit.     No Known Allergies   Review of Systems General:Not Present- Fever, Weight Loss and Weight Gain. Skin:Not Present- Rash. HEENT:Not Present- Blurred Vision, Headache and Bleeding Gums. Respiratory:Not Present- Difficulty Breathing. Breast:Not Present- Breast Mass. Cardiovascular:Not Present- Chest Pain, Elevated Blood Pressure, Fainting / Blacking Out and Shortness of Breath. Gastrointestinal:Not Present- Abdominal Pain, Constipation, Nausea and Vomiting. Female Genitourinary:H/o ovarian cysts. Not Present- Frequency, Painful Urination, Pelvic Pain, Vaginal Bleeding, Vaginal Discharge, Contractions, regular, Fetal Movements Decreased, Urinary Complaints and Vaginal Fluid. Musculoskeletal:Not Present- Back Pain and Leg Cramps. Neurological:Not Present- Dizziness. Psychiatric:Not Present- Depression.     Objective:   Blood pressure 105/68, pulse 81, weight 157 lb 11.2 oz (71.5 kg), last menstrual period 05/13/2016.  Body mass index is 26.24 kg/m.   General Appearance:    Alert, cooperative, no distress, appears stated age, overweight  Head:    Normocephalic, without obvious abnormality, atraumatic  Eyes:    PERRL, conjunctiva/corneas clear, EOM's intact, both eyes  Ears:    Normal external ear canals, both ears  Nose:   Nares normal,  septum midline, mucosa normal, no drainage or sinus tenderness  Throat:   Lips, mucosa, and tongue normal; teeth and gums normal  Neck:   Supple, symmetrical, trachea midline, no adenopathy; thyroid: no enlargement/tenderness/nodules; no carotid bruit or JVD  Back:     Symmetric, no curvature, ROM normal, no CVA tenderness  Lungs:     Clear to auscultation bilaterally, respirations unlabored  Chest Hara:    No tenderness or deformity   Heart:    Regular rate and rhythm, S1 and S2 normal, no murmur, rub or gallop  Breast Exam:    No tenderness, masses,  or nipple abnormality  Abdomen:     Soft, non-tender, bowel sounds active all four quadrants, no masses, no organomegaly.  FH 13.  FHT 173 bpm.  Genitalia:    Pelvic:external genitalia normal, vagina without lesions, discharge, or tenderness, rectovaginal septum  normal. Cervix normal in appearance, no cervical motion tenderness, no adnexal masses or tenderness.  Pregnancy positive findings: uterine enlargement: 12-14 wk size, nontender.   Rectal:    Normal external sphincter.  No hemorrhoids appreciated. Internal exam not done.   Extremities:   Extremities normal, atraumatic, no cyanosis or edema  Pulses:   2+ and symmetric all extremities  Skin:   Skin color, texture, turgor normal, no rashes or lesions  Lymph nodes:   Cervical, supraclavicular, and axillary nodes normal  Neurologic:   CNII-XII intact, normal strength, sensation and reflexes throughout       Assessment:   Pregnancy at 13 and 1/7 weeks  History of prior preterm delivery (multiple gestation) History of Cesarean section H/o ovarian cysts Overweight  Rubella non-immune  Plan:   1. Pregnancy at 13 and 1/7 weeks  - Initial labs reviewed.  Rubella non-immune.  Will need vaccination postpartum.  - Prenatal vitamins encouraged. - Problem list reviewed and updated. - New OB counseling:  The patient has been given an overview regarding routine prenatal care.   - Prenatal testing, optional genetic testing, and ultrasound use in pregnancy were reviewed.  AFP3 discussed:ordered today.  - Benefits of Breast Feeding were discussed. The patient is encouraged to consider nursing her baby post partum.  2. History of prior preterm delivery (multiple gestation) - Discussion had with patient that based on history or preterm delivery, she may be a candidate for 17-OHP, however due to twin gestation (which are anticipated to have earlier deliveries), there may not be much added benefit as the preterm delivery could have been solely due to  multifetal gestation.  Given information to review, if desired, patient to notify MD, and will order.   3. History of Cesarean section x 1 - Brief discussion had on TOLAC vs repeat C-section.  Patient notes that she was told by last OB/GYN that she could attempt a trial of labor if desired in future pregnancies.   Patient unsure currently, information given to review.   4. Overweight  - Recommendations regarding diet, weight gain, and exercise in pregnancy were given.  Patient advised to gain no more than 35-40 lbs in pregnancy.   5.  H/o ovarian cyst - Bilateral ovarian cysts seen on dating ultrasound (3 cm simple on right, 5 cm complex on left). Will f/u on ultrasound today.   Follow up in 4 weeks.   50% of 30 min visit spent on counseling and coordination of care.

## 2016-08-16 ENCOUNTER — Encounter: Payer: Self-pay | Admitting: Obstetrics and Gynecology

## 2016-09-17 ENCOUNTER — Ambulatory Visit (INDEPENDENT_AMBULATORY_CARE_PROVIDER_SITE_OTHER): Payer: Medicaid Other | Admitting: Certified Nurse Midwife

## 2016-09-17 VITALS — BP 117/76 | HR 74 | Wt 162.4 lb

## 2016-09-17 DIAGNOSIS — Z3482 Encounter for supervision of other normal pregnancy, second trimester: Secondary | ICD-10-CM

## 2016-09-17 LAB — POCT URINALYSIS DIPSTICK
Bilirubin, UA: NEGATIVE
Blood, UA: NEGATIVE
Glucose, UA: NEGATIVE
KETONES UA: NEGATIVE
LEUKOCYTES UA: NEGATIVE
NITRITE UA: NEGATIVE
PH UA: 7.5
PROTEIN UA: NEGATIVE
Spec Grav, UA: 1.01
Urobilinogen, UA: NEGATIVE

## 2016-09-17 NOTE — Progress Notes (Addendum)
ROB-Pt doing well. Questions about exercising while pregnant answered. Discussed round ligament pain and treatment measures. RTC x 2 wks for anatomy scan and f/u with Dr Valentino Saxonherry in 4 wks.

## 2016-09-17 NOTE — Patient Instructions (Signed)
Second Trimester of Pregnancy The second trimester is from week 13 through week 28, month 4 through 6. This is often the time in pregnancy that you feel your best. Often times, morning sickness has lessened or quit. You may have more energy, and you may get hungry more often. Your unborn baby (fetus) is growing rapidly. At the end of the sixth month, he or she is about 9 inches long and weighs about 1 pounds. You will likely feel the baby move (quickening) between 18 and 20 weeks of pregnancy. Follow these instructions at home:  Avoid all smoking, herbs, and alcohol. Avoid drugs not approved by your doctor.  Do not use any tobacco products, including cigarettes, chewing tobacco, and electronic cigarettes. If you need help quitting, ask your doctor. You may get counseling or other support to help you quit.  Only take medicine as told by your doctor. Some medicines are safe and some are not during pregnancy.  Exercise only as told by your doctor. Stop exercising if you start having cramps.  Eat regular, healthy meals.  Wear a good support bra if your breasts are tender.  Do not use hot tubs, steam rooms, or saunas.  Wear your seat belt when driving.  Avoid raw meat, uncooked cheese, and liter boxes and soil used by cats.  Take your prenatal vitamins.  Take 1500-2000 milligrams of calcium daily starting at the 20th week of pregnancy until you deliver your baby.  Try taking medicine that helps you poop (stool softener) as needed, and if your doctor approves. Eat more fiber by eating fresh fruit, vegetables, and whole grains. Drink enough fluids to keep your pee (urine) clear or pale yellow.  Take warm water baths (sitz baths) to soothe pain or discomfort caused by hemorrhoids. Use hemorrhoid cream if your doctor approves.  If you have puffy, bulging veins (varicose veins), wear support hose. Raise (elevate) your feet for 15 minutes, 3-4 times a day. Limit salt in your diet.  Avoid heavy  lifting, wear low heals, and sit up straight.  Rest with your legs raised if you have leg cramps or low back pain.  Visit your dentist if you have not gone during your pregnancy. Use a soft toothbrush to brush your teeth. Be gentle when you floss.  You can have sex (intercourse) unless your doctor tells you not to.  Go to your doctor visits. Get help if:  You feel dizzy.  You have mild cramps or pressure in your lower belly (abdomen).  You have a nagging pain in your belly area.  You continue to feel sick to your stomach (nauseous), throw up (vomit), or have watery poop (diarrhea).  You have bad smelling fluid coming from your vagina.  You have pain with peeing (urination). Get help right away if:  You have a fever.  You are leaking fluid from your vagina.  You have spotting or bleeding from your vagina.  You have severe belly cramping or pain.  You lose or gain weight rapidly.  You have trouble catching your breath and have chest pain.  You notice sudden or extreme puffiness (swelling) of your face, hands, ankles, feet, or legs.  You have not felt the baby move in over an hour.  You have severe headaches that do not go away with medicine.  You have vision changes. This information is not intended to replace advice given to you by your health care provider. Make sure you discuss any questions you have with your health care   provider. Document Released: 12/11/2009 Document Revised: 02/22/2016 Document Reviewed: 11/17/2012 Elsevier Interactive Patient Education  2017 [employer]  

## 2016-09-30 DIAGNOSIS — O24419 Gestational diabetes mellitus in pregnancy, unspecified control: Secondary | ICD-10-CM

## 2016-09-30 HISTORY — DX: Gestational diabetes mellitus in pregnancy, unspecified control: O24.419

## 2016-09-30 NOTE — L&D Delivery Note (Signed)
Delivery Note   Alyssa Mcconnell is a 36 y.o. G2P0102 at 5573w2d Estimated Date of Delivery: 02/17/17  PRE-OPERATIVE DIAGNOSIS:  1) 4573w2d pregnancy.   POST-OPERATIVE DIAGNOSIS:  1) 4373w2d pregnancy s/p Vaginal, Spontaneous Delivery   Delivery Type: Vaginal, Spontaneous Delivery    Delivery Clinician: Doreene BurkeHOMPSON, Alyviah Crandle   Delivery Anesthesia: None   Labor Complications:     None  ESTIMATED BLOOD LOSS: 350  ml    FINDINGS:   1) female infant, Apgar scores of 8    at 1 minute and 9    at 5 minutes and a birthweight of 130.16  ounces.    2) Nuchal cord: No  SPECIMENS:   PLACENTA:   Appearance: Intact    Removal: Spontaneous      Disposition:   3 vessel cord, normal in appearance, intact.  Placenta held per protocol, then discarded  DISPOSITION:  Infant to left in stable condition in the delivery room, with L&D personnel and mother,  NARRATIVE SUMMARY: Labor course:  Ms. Alyssa JulianShana S Furney is a [id number] at 6973w2d who presented for labor management.  She progressed well in labor without pitocin.  She received the appropriate anesthesia and proceeded to complete dilation. She evidenced good maternal expulsive effort during the second stage. She went on to deliver a viable female infant "Hudson". The placenta delivered without problems and was noted to be complete. A perineal and vaginal examination was performed. Episiotomy/Lacerations: 2nd degree   Lacerations were repaired with 3-0 raped Vicryl suture using local anesthesia. The patient tolerated this well.  Doreene Burkennie Kahli Mayon, CNM 02/13/2017 10:01 AM

## 2016-10-01 ENCOUNTER — Ambulatory Visit (INDEPENDENT_AMBULATORY_CARE_PROVIDER_SITE_OTHER): Payer: Medicaid Other

## 2016-10-01 DIAGNOSIS — Z3482 Encounter for supervision of other normal pregnancy, second trimester: Secondary | ICD-10-CM | POA: Diagnosis not present

## 2016-10-17 ENCOUNTER — Encounter: Payer: Medicaid Other | Admitting: Certified Nurse Midwife

## 2016-10-18 ENCOUNTER — Ambulatory Visit (INDEPENDENT_AMBULATORY_CARE_PROVIDER_SITE_OTHER): Payer: Medicaid Other | Admitting: Certified Nurse Midwife

## 2016-10-18 VITALS — BP 120/84 | HR 95 | Wt 167.2 lb

## 2016-10-18 DIAGNOSIS — Z3482 Encounter for supervision of other normal pregnancy, second trimester: Secondary | ICD-10-CM

## 2016-10-18 LAB — POCT URINALYSIS DIPSTICK
BILIRUBIN UA: NEGATIVE
Blood, UA: NEGATIVE
Glucose, UA: NEGATIVE
KETONES UA: NEGATIVE
Leukocytes, UA: NEGATIVE
Nitrite, UA: NEGATIVE
PH UA: 7
PROTEIN UA: NEGATIVE
SPEC GRAV UA: 1.01
Urobilinogen, UA: NEGATIVE

## 2016-10-18 NOTE — Progress Notes (Signed)
ROB-Pt doing well. No questions or concerns. Reviewed red flag symptoms and when to call. RTC x 4 weeks for ROB.

## 2016-11-15 ENCOUNTER — Ambulatory Visit (INDEPENDENT_AMBULATORY_CARE_PROVIDER_SITE_OTHER): Payer: Medicaid Other | Admitting: Obstetrics and Gynecology

## 2016-11-15 VITALS — BP 132/73 | HR 90 | Wt 171.5 lb

## 2016-11-15 DIAGNOSIS — Z3492 Encounter for supervision of normal pregnancy, unspecified, second trimester: Secondary | ICD-10-CM

## 2016-11-15 LAB — POCT URINALYSIS DIPSTICK
Bilirubin, UA: NEGATIVE
GLUCOSE UA: NEGATIVE
KETONES UA: NEGATIVE
LEUKOCYTES UA: NEGATIVE
Nitrite, UA: NEGATIVE
PROTEIN UA: NEGATIVE
SPEC GRAV UA: 1.01
UROBILINOGEN UA: 0.2
pH, UA: 6

## 2016-11-15 NOTE — Progress Notes (Signed)
ROB-doing well, discussed TOLAC, will get op-note. glucola next visit.

## 2016-11-15 NOTE — Progress Notes (Signed)
ROB- pt is doing well 

## 2016-11-25 ENCOUNTER — Other Ambulatory Visit: Payer: Self-pay | Admitting: Obstetrics and Gynecology

## 2016-11-25 DIAGNOSIS — O4440 Low lying placenta NOS or without hemorrhage, unspecified trimester: Secondary | ICD-10-CM

## 2016-11-26 ENCOUNTER — Other Ambulatory Visit: Payer: Medicaid Other

## 2016-11-27 ENCOUNTER — Ambulatory Visit (INDEPENDENT_AMBULATORY_CARE_PROVIDER_SITE_OTHER): Payer: Medicaid Other | Admitting: Obstetrics and Gynecology

## 2016-11-27 ENCOUNTER — Other Ambulatory Visit: Payer: Medicaid Other

## 2016-11-27 ENCOUNTER — Ambulatory Visit (INDEPENDENT_AMBULATORY_CARE_PROVIDER_SITE_OTHER): Payer: Medicaid Other

## 2016-11-27 VITALS — BP 115/82 | HR 90 | Wt 174.1 lb

## 2016-11-27 DIAGNOSIS — O4440 Low lying placenta NOS or without hemorrhage, unspecified trimester: Secondary | ICD-10-CM

## 2016-11-27 DIAGNOSIS — Z3493 Encounter for supervision of normal pregnancy, unspecified, third trimester: Secondary | ICD-10-CM

## 2016-11-27 DIAGNOSIS — Z131 Encounter for screening for diabetes mellitus: Secondary | ICD-10-CM

## 2016-11-27 LAB — POCT URINALYSIS DIPSTICK
Bilirubin, UA: NEGATIVE
Blood, UA: NEGATIVE
Glucose, UA: NEGATIVE
Ketones, UA: NEGATIVE
LEUKOCYTES UA: NEGATIVE
NITRITE UA: NEGATIVE
PROTEIN UA: NEGATIVE
Spec Grav, UA: 1.01
UROBILINOGEN UA: 0.2
pH, UA: 6.5

## 2016-11-27 NOTE — Progress Notes (Signed)
ROB & glucola & f/u u/s:   Indications: Rec[facility]ck [facility] Findings:  Singleton intrauterine pregnancy is visualized with FHR at 139 BPM.   Fetal presentation is vertex, spine posterior.  Placenta: Anterior, grade 1 and no longer low lying. Placenta is now 5.1 cm from the cervical os, this was only seen on transvaginal approach as baby's head was too low to see transabdominally. AFI: Subjectively adequate  Anatomic survey of the fetal stomach, bladder and kidneys appear WNL. Gender - Female.     Impression: 1. The anterior placenta is no longer low lying. It is now 5.1 cm from the cervical os.

## 2016-11-27 NOTE — Progress Notes (Signed)
ROB- blood consent signed, glucola done,pt is doing well

## 2016-11-28 ENCOUNTER — Other Ambulatory Visit: Payer: Self-pay | Admitting: Obstetrics and Gynecology

## 2016-11-28 DIAGNOSIS — R7309 Other abnormal glucose: Secondary | ICD-10-CM

## 2016-11-28 LAB — HEMOGLOBIN AND HEMATOCRIT, BLOOD
HEMOGLOBIN: 12.3 g/dL (ref 11.1–15.9)
Hematocrit: 36.4 % (ref 34.0–46.6)

## 2016-11-28 LAB — GLUCOSE, 1 HOUR GESTATIONAL: Gestational Diabetes Screen: 149 mg/dL — ABNORMAL HIGH (ref 65–139)

## 2016-12-02 ENCOUNTER — Other Ambulatory Visit: Payer: Self-pay

## 2016-12-03 ENCOUNTER — Other Ambulatory Visit: Payer: Medicaid Other

## 2016-12-03 DIAGNOSIS — R7309 Other abnormal glucose: Secondary | ICD-10-CM

## 2016-12-04 LAB — GESTATIONAL GLUCOSE TOLERANCE
GLUCOSE 2 HOUR GTT: 164 mg/dL — AB (ref 65–154)
Glucose, Fasting: 86 mg/dL (ref 65–94)
Glucose, GTT - 1 Hour: 203 mg/dL — ABNORMAL HIGH (ref 65–179)
Glucose, GTT - 3 Hour: 131 mg/dL (ref 65–139)

## 2016-12-11 ENCOUNTER — Encounter: Payer: Self-pay | Admitting: Certified Nurse Midwife

## 2016-12-11 ENCOUNTER — Ambulatory Visit (INDEPENDENT_AMBULATORY_CARE_PROVIDER_SITE_OTHER): Payer: Medicaid Other | Admitting: Certified Nurse Midwife

## 2016-12-11 VITALS — BP 123/81 | HR 100 | Wt 178.8 lb

## 2016-12-11 DIAGNOSIS — Z3483 Encounter for supervision of other normal pregnancy, third trimester: Secondary | ICD-10-CM

## 2016-12-11 DIAGNOSIS — O0940 Supervision of pregnancy with grand multiparity, unspecified trimester: Secondary | ICD-10-CM

## 2016-12-11 DIAGNOSIS — O24419 Gestational diabetes mellitus in pregnancy, unspecified control: Secondary | ICD-10-CM

## 2016-12-11 LAB — POCT URINALYSIS DIPSTICK
BILIRUBIN UA: NEGATIVE
Blood, UA: NEGATIVE
Glucose, UA: NEGATIVE
KETONES UA: NEGATIVE
LEUKOCYTES UA: NEGATIVE
Nitrite, UA: NEGATIVE
Protein, UA: NEGATIVE
SPEC GRAV UA: 1.015
Urobilinogen, UA: 0.2
pH, UA: 6

## 2016-12-13 ENCOUNTER — Encounter: Payer: Self-pay | Admitting: Certified Nurse Midwife

## 2016-12-13 NOTE — Progress Notes (Signed)
ROB, no complaints. Denies LOF, Vaginal bleeding and contractions. She endorse good fetal movement. Reviewed abnormal 3 hr Glucola results. Discussed gestational diabetes in pregnancy and diet. Life styles diabetes education referral requested. ROB in 2 wks.   Doreene BurkeAnnie Deidrea Gaetz, CNM

## 2016-12-13 NOTE — Patient Instructions (Addendum)
How a Baby Grows During Pregnancy Pregnancy begins when a female's sperm enters a female's egg (fertilization). This happens in one of the tubes (fallopian tubes) that connect the ovaries to the womb (uterus). The fertilized egg is called an embryo until it reaches 10 weeks. From 10 weeks until birth, it is called a fetus. The fertilized egg moves down the fallopian tube to the uterus. Then it implants into the lining of the uterus and begins to grow. The developing fetus receives oxygen and nutrients through the pregnant woman's bloodstream and the tissues that grow (placenta) to support the fetus. The placenta is the life support system for the fetus. It provides nutrition and removes waste. Learning as much as you can about your pregnancy and how your baby is developing can help you enjoy the experience. It can also make you aware of when there might be a problem and when to ask questions. How long does a typical pregnancy last? A pregnancy usually lasts 280 days, or about 40 weeks. Pregnancy is divided into three trimesters:  First trimester: 0-13 weeks.  Second trimester: 14-27 weeks.  Third trimester: 28-40 weeks. The day when your baby is considered ready to be born (full term) is your estimated date of delivery. How does my baby develop month by month? First month  The fertilized egg attaches to the inside of the uterus.  Some cells will form the placenta. Others will form the fetus.  The arms, legs, brain, spinal cord, lungs, and heart begin to develop.  At the end of the first month, the heart begins to beat. Second month  The bones, inner ear, eyelids, hands, and feet form.  The genitals develop.  By the end of 8 weeks, all major organs are developing. Third month  All of the internal organs are forming.  Teeth develop below the gums.  Bones and muscles begin to grow. The spine can flex.  The skin is transparent.  Fingernails and toenails begin to form.  Arms and  legs continue to grow longer, and hands and feet develop.  The fetus is about 3 in (7.6 cm) long. Fourth month  The placenta is completely formed.  The external sex organs, neck, outer ear, eyebrows, eyelids, and fingernails are formed.  The fetus can hear, swallow, and move its arms and legs.  The kidneys begin to produce urine.  The skin is covered with a white waxy coating (vernix) and very fine hair (lanugo). Fifth month  The fetus moves around more and can be felt for the first time (quickening).  The fetus starts to sleep and wake up and may begin to suck its finger.  The nails grow to the end of the fingers.  The organ in the digestive system that makes bile (gallbladder) functions and helps to digest the nutrients.  If your baby is a girl, eggs are present in her ovaries. If your baby is a boy, testicles start to move down into his scrotum. Sixth month  The lungs are formed, but the fetus is not yet able to breathe.  The eyes open. The brain continues to develop.  Your baby has fingerprints and toe prints. Your baby's hair grows thicker.  At the end of the second trimester, the fetus is about 9 in (22.9 cm) long. Seventh month  The fetus kicks and stretches.  The eyes are developed enough to sense changes in light.  The hands can make a grasping motion.  The fetus responds to sound. Eighth month  All organs and body systems are fully developed and functioning.  Bones harden and taste buds develop. The fetus may hiccup.  Certain areas of the brain are still developing. The skull remains soft. Ninth month  The fetus gains about  lb (0.23 kg) each week.  The lungs are fully developed.  Patterns of sleep develop.  The fetus's head typically moves into a head-down position (vertex) in the uterus to prepare for birth. If the buttocks move into a vertex position instead, the baby is breech.  The fetus weighs 6-9 lbs (2.72-4.08 kg) and is 19-20 in  (48.26-50.8 cm) long. What can I do to have a healthy pregnancy and help my baby develop?  Eating and Drinking  Eat a healthy diet.  Talk with your health care provider to make sure that you are getting the nutrients that you and your baby need.  Visit www.BuildDNA.es to learn about creating a healthy diet.  Gain a healthy amount of weight during pregnancy as advised by your health care provider. This is usually 25-35 pounds. You may need to:  Gain more if you were underweight before getting pregnant or if you are pregnant with more than one baby.  Gain less if you were overweight or obese when you got pregnant. Medicines and Vitamins  Take prenatal vitamins as directed by your health care provider. These include vitamins such as folic acid, iron, calcium, and vitamin D. They are important for healthy development.  Take medicines only as directed by your health care provider. Read labels and ask a pharmacist or your health care provider whether over-the-counter medicines, supplements, and prescription drugs are safe to take during pregnancy. Activities  Be physically active as advised by your health care provider. Ask your health care provider to recommend activities that are safe for you to do, such as walking or swimming.  Do not participate in strenuous or extreme sports. Lifestyle  Do not drink alcohol.  Do not use any tobacco products, including cigarettes, chewing tobacco, or electronic cigarettes. If you need help quitting, ask your health care provider.  Do not use illegal drugs. Safety  Avoid exposure to mercury, lead, or other heavy metals. Ask your health care provider about common sources of these heavy metals.  Avoid listeria infection during pregnancy. Follow these precautions:  Do not eat soft cheeses or deli meats.  Do not eat hot dogs unless they have been warmed up to the point of steaming, such as in the microwave oven.  Do not drink unpasteurized  milk.  Avoid toxoplasmosis infection during pregnancy. Follow these precautions:  Do not change your cat's litter box, if you have a cat. Ask someone else to do this for you.  Wear gardening gloves while working in the yard. General Instructions  Keep all follow-up visits as directed by your health care provider. This is important. This includes prenatal care and screening tests.  Manage any chronic health conditions. Work closely with your health care provider to keep conditions, such as diabetes, under control. How do I know if my baby is developing well? At each prenatal visit, your health care provider will do several different tests to check on your health and keep track of your baby's development. These include:  Fundal height.  Your health care provider will measure your growing belly from top to bottom using a tape measure.  Your health care provider will also feel your belly to determine your baby's position.  Heartbeat.  An ultrasound in the first trimester can  confirm pregnancy and show a heartbeat, depending on how far along you are.  Your health care provider will check your baby's heart rate at every prenatal visit.  As you get closer to your delivery date, you may have regular fetal heart rate monitoring to make sure that your baby is not in distress.  Second trimester ultrasound.  This ultrasound checks your baby's development. It also indicates your baby's gender. What should I do if I have concerns about my baby's development? Always talk with your health care provider about any concerns that you may have. This information is not intended to replace advice given to you by your health care provider. Make sure you discuss any questions you have with your health care provider. Document Released: 03/04/2008 Document Revised: 02/22/2016 Document Reviewed: 02/23/2014 Elsevier Interactive Patient Education  2017 [employer]  Gestational Diabetes Mellitus, Self  Care When you have gestational diabetes (gestational diabetes mellitus), you must keep your blood sugar (glucose) under control. You can do this with:  Nutrition.  Exercise.  Lifestyle changes.  Medicines or insulin, if needed.  Support from your doctors and others. How do I manage my blood sugar?  Check your blood sugar every day during pregnancy. Check it as often as told.  Call your doctor if your blood sugar is above your goal numbers for 2 tests in a row. Your doctor will set treatment goals for you. Try to have these blood sugars:  After not eating for a long time (after fasting): at or below 95 mg/dL (5.3 mmol/L).  After meals (postprandial):  One hour after a meal: at or below 140 mg/dL (7.8 mmol/L).  Two hours after a meal: at or below 120 mg/dL (6.7 mmol/L).  A1c (hemoglobin A1c) level: 6-6.5%. What do I need to know about high blood sugar? High blood sugar is called hyperglycemia. Know the early signs of high blood sugar. Signs include:  Feeling:  Thirsty.  Hungry.  Very tired.  Needing to pee (urinate) more than usual.  Blurry vision. What do I need to know about low blood sugar? Low blood sugar is called hypoglycemia. This is when blood sugar is at or below 70 mg/dL (3.9 mmol/L). Symptoms may include:  Feeling:  Hungry.  Worried or nervous (anxious).  Sweaty or clammy.  Confused.  Dizzy.  Sleepy.  Sick to your stomach (nauseous).  Having:  A fast heartbeat.  A headache.  A change in vision.  Jerky movements that you cannot control (seizure).  Nightmares.  Tingling or no feeling (numbness) around the mouth, lips, or tongue.  Having trouble with:  Talking.  Paying attention (concentrating).  Moving (coordination).  Sleeping.  Shaking.  Passing out (fainting).  Getting upset easily (irritability). Treating low blood sugar   To treat low blood sugar, eat or drink something sugary right away. If you can think clearly  and swallow safely, follow the 15:15 rule:  Take 15 grams of a fast-acting carb (carbohydrate). Some fast-acting carbs are:  1 tube of glucose gel.  3 sugar tablets (glucose pills).  6-8 pieces of hard candy.  4 oz (120 mL) of fruit juice.  4 oz (120 mL) regular (not diet) soda.  Check your blood sugar 15 minutes after you take the carb.  If your blood sugar is still at or below 70 mg/dL (3.9 mmol/L), take 15 grams of a carb again.  If your blood sugar does not go above 70 mg/dL (3.9 mmol/L) after 3 tries, get help right away.  After  your blood sugar goes back to normal, eat a meal or a snack within 1 hour. Treating very low blood sugar  If your blood sugar is at or below 54 mg/dL (3 mmol/L), you have very low blood sugar (severe hypoglycemia). This is an emergency. Do not wait to see if the symptoms will go away. Get medical help right away. Call your local emergency services (911 in the [place] Do not drive yourself to the hospital. If you have very low blood sugar and you cannot eat or drink, you may need a glucagon shot (injection). A family member or friend should learn:  How to check your blood sugar.  How to give you a glucagon shot. Ask your doctor if you need a glucagon shot kit at home. What else is important to manage my diabetes? Medicine   Take your insulin and diabetes medicines as told.  Do not run out of insulin or medicines.  Adjust your insulin and diabetes medicines as told. Food    Make healthy food choices. These include:  Chicken, fish, egg whites, and beans.  Oats, whole wheat, bulgur, brown rice, quinoa, and millet.  Fresh fruits and vegetables.  Low-fat dairy products.  Nuts, avocado, olive oil, and canola oil.  Make an eating plan. A food specialist (dietitian) can help you.  Follow instructions from your doctor about what you cannot eat or drink.  Drink enough fluid to keep your pee (urine) clear or pale yellow.  Eat healthy snacks  between healthy meals.  Keep track of carbs you eat. Read food labels. Learn about food serving sizes.  Follow your sick day plan when you cannot eat or drink normally. Make this plan with your doctor. Activity   Exercise 30 minutes or more a day or as much as told by your doctor.  Talk with your doctor if you start a new exercise. Your doctor may need to adjust your insulin, medicines, or food. Lifestyle   Do not drink alcohol.  Do not use any tobacco products. If you need help quitting, ask your doctor.  Learn how to deal with stress. If you need help with this, ask your doctor. Body care    Stay up to date with your shots (immunizations).  Brush your teeth and gums two times a day. Floss at least one time a day.  Go to the dentist least one time every 6 months.  Stay at a healthy weight while you are pregnant. General instructions   Take over-the-counter and prescription medicines only as told by your doctor.  Ask your doctor about risks of high blood pressure in pregnancy. These are called preeclampsia and eclampsia.  Share your diabetes care plan with:  Your work or school.  People you live with.  Check your pee for ketones:  When you are sick.  As told by your doctor.  Ask your doctor:  Do I need to meet with a diabetes educator?  Where can I find a support group for people with diabetes?  Carry a card or wear jewelry that says that you have diabetes.  Keep all follow-up visits with your doctor. This is important. Care after giving birth    Have your blood sugar checked 4-12 weeks after you give birth.  Get checked for diabetes at least every 3 years. Where to find more information: To learn more about diabetes, visit:  American Diabetes Association: www.diabetes.org/diabetes-basics/gestational  Centers for Disease Control and Prevention (CDC): http://sanchez-watson.com/.pdf This information is not intended to replace  advice given to you by your health care provider. Make sure you discuss any questions you have with your health care provider. Document Released: 01/08/2016 Document Revised: 02/22/2016 Document Reviewed: 10/20/2015 Elsevier Interactive Patient Education  2017 [employer].

## 2016-12-15 ENCOUNTER — Encounter: Payer: Self-pay | Admitting: Certified Nurse Midwife

## 2016-12-17 ENCOUNTER — Ambulatory Visit: Payer: Self-pay | Admitting: *Deleted

## 2016-12-19 ENCOUNTER — Telehealth: Payer: Self-pay | Admitting: *Deleted

## 2016-12-19 ENCOUNTER — Encounter: Payer: Medicaid Other | Attending: Certified Nurse Midwife | Admitting: *Deleted

## 2016-12-19 ENCOUNTER — Encounter: Payer: Self-pay | Admitting: *Deleted

## 2016-12-19 VITALS — BP 112/80 | Ht 64.0 in | Wt 175.9 lb

## 2016-12-19 DIAGNOSIS — Z3A00 Weeks of gestation of pregnancy not specified: Secondary | ICD-10-CM | POA: Insufficient documentation

## 2016-12-19 DIAGNOSIS — O0940 Supervision of pregnancy with grand multiparity, unspecified trimester: Secondary | ICD-10-CM | POA: Diagnosis present

## 2016-12-19 DIAGNOSIS — O24419 Gestational diabetes mellitus in pregnancy, unspecified control: Secondary | ICD-10-CM | POA: Insufficient documentation

## 2016-12-19 DIAGNOSIS — O24410 Gestational diabetes mellitus in pregnancy, diet controlled: Secondary | ICD-10-CM

## 2016-12-19 DIAGNOSIS — Z713 Dietary counseling and surveillance: Secondary | ICD-10-CM | POA: Diagnosis present

## 2016-12-19 NOTE — Progress Notes (Signed)
Diabetes Self-Management Education  Visit Type: First/Initial  Appt. Start Time: 0850 Appt. End Time: 1020  12/19/2016  [name], identified by name and date of birth, is a 36 y.o. female with a diagnosis of Diabetes: Gestational Diabetes.   ASSESSMENT  Blood pressure 112/80, height 5\' 4"  (1.626 m), weight 175 lb 14.4 oz (79.8 kg), last menstrual period 05/13/2016. Body mass index is 30.19 kg/m.      Diabetes Self-Management Education - 12/19/16 1314      Visit Information   Visit Type First/Initial     Initial Visit   Diabetes Type Gestational Diabetes   Are you currently following a meal plan? Yes   What type of meal plan do you follow? "cutting out carbs and sugars"   Are you taking your medications as prescribed? Yes   Date Diagnosed 1 week ago     Health Coping   How would you rate your overall health? Excellent     Psychosocial Assessment   Patient Belief/Attitude about Diabetes Motivated to manage diabetes  "it made me sad"   Self-care barriers None   Self-management support Doctor's office;Family   Other persons present Spouse/SO   Patient Concerns Nutrition/Meal planning;Glycemic Control;Healthy Lifestyle   Special Needs None   Preferred Learning Style Auditory;Visual   Learning Readiness Change in progress   How often do you need to have someone help you when you read instructions, pamphlets, or other written materials from your doctor or pharmacy? 1 - Never   What is the last grade level you completed in school? high school     Pre-Education Assessment   Patient understands the diabetes disease and treatment process. Needs Instruction   Patient understands incorporating nutritional management into lifestyle. Needs Instruction   Patient undertands incorporating physical activity into lifestyle. Needs Instruction   Patient understands using medications safely. Needs Instruction   Patient understands monitoring blood glucose, interpreting and using  results Needs Instruction   Patient understands prevention, detection, and treatment of acute complications. Needs Instruction   Patient understands prevention, detection, and treatment of chronic complications. Needs Instruction   Patient understands how to develop strategies to address psychosocial issues. Needs Instruction   Patient understands how to develop strategies to promote health/change behavior. Needs Instruction     Complications   How often do you check your blood sugar? 0 times/day (not testing)  Provided Accu-Chek Guide meter and instructed on use. BG upon return demonstration was 86 mg/dL at 40:98 am - 2 hrs pp.   Have you had a dilated eye exam in the past 12 months? No   Have you had a dental exam in the past 12 months? No   Are you checking your feet? No     Dietary Intake   Breakfast toast, yogurt, butter or chicken biscuit   Lunch salad or leftovers from supper   Snack (afternoon) nuts, crackers, ice cream   Dinner chicken, beef, [place], occasional pork; green beans, corn, potatoes, carrots   Beverage(s) water, coffee     Exercise   Exercise Type Light (walking / raking leaves)   How many days per week to you exercise? 1   How many minutes per day do you exercise? 20   Total minutes per week of exercise 20     Patient Education   Previous Diabetes Education No   Disease state  Definition of diabetes, type 1 and 2, and the diagnosis of diabetes   Nutrition management  Role of diet in the treatment  of diabetes and the relationship between the three main macronutrients and blood glucose level;Reviewed blood glucose goals for pre and post meals and how to evaluate the patients' food intake on their blood glucose level.   Physical activity and exercise  Role of exercise on diabetes management, blood pressure control and cardiac health.   Monitoring Taught/evaluated SMBG meter.;Purpose and frequency of SMBG.;Taught/discussed recording of test results and interpretation  of SMBG.;Ketone testing, when, how.   Chronic complications Relationship between chronic complications and blood glucose control   Psychosocial adjustment Identified and addressed patients feelings and concerns about diabetes   Preconception care Pregnancy and GDM  Role of pre-pregnancy blood glucose control on the development of the fetus;Reviewed with patient blood glucose goals with pregnancy;Role of family planning for patients with diabetes     Individualized Goals (developed by patient)   Reducing Risk Improve blood sugars Lead a healthier lifestyle     Outcomes   Expected Outcomes Demonstrated interest in learning. Expect positive outcomes      Individualized Plan for Diabetes Self-Management Training:   Learning Objective:  Patient will have a greater understanding of diabetes self-management. Patient education plan is to attend individual and/or group sessions per assessed needs and concerns.   Plan:   Patient Instructions  Read booklet on Gestational Diabetes Follow Gestational Meal Planning Guidelines Don't delay meals  Complete a 3 Day Food Record and bring to next appointment Check blood sugars 4 x day - before breakfast and 2 hrs after every meal and record  Bring blood sugar log to all appointments Call MD for prescription for meter strips and lancets Strips   Accu-Chek Guide Lancets   Accu-Chek Fastclix Purchase urine ketone strips if blood sugars not controlled and check urine ketones every am:  If + increase bedtime snack to 1 protein and 2 carbohydrate servings Walk 20-30 minutes at least 5 x week if permitted by MD   Expected Outcomes:  Demonstrated interest in learning. Expect positive outcomes  Education material provided:  Gestational Booklet Gestational Meal Planning Guidelines Viewed Gestational Diabetes Video Meter = Accu-Chek Guide 3 Day Food Record Goals for a Healthy Pregnancy  If problems or questions, patient to contact team via:  Sharion SettlerSheila  Telesforo Brosnahan, RN, CCM, CDE [phone]  Future DSME appointment:  December 25, 2016 for appointment with the dietitian

## 2016-12-19 NOTE — Patient Instructions (Addendum)
Read booklet on Gestational Diabetes Follow Gestational Meal Planning Guidelines Don't delay meals  Complete a 3 Day Food Record and bring to next appointment Check blood sugars 4 x day - before breakfast and 2 hrs after every meal and record  Bring blood sugar log to all appointments Call MD for prescription for meter strips and lancets Strips   Accu-Chek Guide Lancets   Accu-Chek Fastclix Purchase urine ketone strips if blood sugars not controlled and check urine ketones every am:  If + increase bedtime snack to 1 protein and 2 carbohydrate servings Walk 20-30 minutes at least 5 x week if permitted by MD

## 2016-12-19 NOTE — Telephone Encounter (Signed)
Patient called and states she completed her diabetes class and needs a RX for her test strips. The test strips she need is the Accue Chek guide. Her pharmacy is [facility] in [place]. Please advise. Thank you

## 2016-12-20 MED ORDER — GLUCOSE BLOOD VI STRP: ORAL_STRIP | 12 refills | Status: DC

## 2016-12-20 NOTE — Telephone Encounter (Signed)
Left message for pt that rx was sent in. If any problems please contact office.

## 2016-12-25 ENCOUNTER — Ambulatory Visit (INDEPENDENT_AMBULATORY_CARE_PROVIDER_SITE_OTHER): Payer: Medicaid Other | Admitting: Certified Nurse Midwife

## 2016-12-25 ENCOUNTER — Encounter: Payer: Self-pay | Admitting: Certified Nurse Midwife

## 2016-12-25 ENCOUNTER — Encounter: Payer: Self-pay | Admitting: Dietician

## 2016-12-25 ENCOUNTER — Encounter: Payer: Medicaid Other | Admitting: Dietician

## 2016-12-25 VITALS — BP 116/78 | Ht 64.0 in | Wt 174.2 lb

## 2016-12-25 VITALS — BP 109/78 | HR 87 | Wt 174.2 lb

## 2016-12-25 DIAGNOSIS — Z3483 Encounter for supervision of other normal pregnancy, third trimester: Secondary | ICD-10-CM

## 2016-12-25 DIAGNOSIS — O24410 Gestational diabetes mellitus in pregnancy, diet controlled: Secondary | ICD-10-CM

## 2016-12-25 DIAGNOSIS — Z713 Dietary counseling and surveillance: Secondary | ICD-10-CM | POA: Diagnosis not present

## 2016-12-25 LAB — POCT URINALYSIS DIPSTICK
BILIRUBIN UA: NEGATIVE
Blood, UA: NEGATIVE
GLUCOSE UA: NEGATIVE
Nitrite, UA: NEGATIVE
PH UA: 7 (ref 5.0–8.0)
Protein, UA: NEGATIVE
Spec Grav, UA: 1.01 (ref 1.030–1.035)
Urobilinogen, UA: NEGATIVE (ref ?–2.0)

## 2016-12-25 NOTE — Progress Notes (Signed)
ROB- doing well. No complaints. Blood sugars range from 83 (fasting) - 117 ( 2hr. postprandial). She had one that was 152 after attending a party and having pasta/cake. Discussed birth plan. Wants to try to go without an epidural. She will have a growth ultrasound in 2 wks with her ROB.   Doreene BurkeAnnie Flynn Gwyn, CNM

## 2016-12-25 NOTE — Patient Instructions (Signed)

## 2016-12-25 NOTE — Progress Notes (Signed)
   Patient's BG record indicates BGs are within goal ranges; one elevated reading after eating pasta, bread and cupcake for a birthday party.   Patient's food diary indicates generally healthy food choices and balanced meals; some meals slightly low in carbohydrates. Patient states she has been extra cautious to limit carbs to make sure her BGs did not become elevated.    Provided 1900kcal meal plan, and wrote individualized menus based on patient's food preferences.  Instructed patient on food safety, including avoidance of Listeriosis, and limiting mercury from fish.  Discussed importance of maintaining healthy lifestyle habits to reduce risk of Type 2 DM as well as Gestational DM with any future pregnancies.  Advised patient to use any remaining testing supplies to test some BGs after delivery, and to have BG tested ideally annually, as well as prior to attempting future pregnancies.

## 2016-12-25 NOTE — Patient Instructions (Signed)
Continue with current eating pattern, OK to increase carbohydrates by 1 servings with most meals.

## 2017-01-07 ENCOUNTER — Ambulatory Visit (INDEPENDENT_AMBULATORY_CARE_PROVIDER_SITE_OTHER): Payer: Medicaid Other

## 2017-01-07 ENCOUNTER — Encounter: Payer: Self-pay | Admitting: Certified Nurse Midwife

## 2017-01-07 ENCOUNTER — Ambulatory Visit (INDEPENDENT_AMBULATORY_CARE_PROVIDER_SITE_OTHER): Payer: Medicaid Other | Admitting: Certified Nurse Midwife

## 2017-01-07 VITALS — BP 108/75 | HR 81 | Wt 174.9 lb

## 2017-01-07 DIAGNOSIS — R8299 Other abnormal findings in urine: Secondary | ICD-10-CM

## 2017-01-07 DIAGNOSIS — Z3483 Encounter for supervision of other normal pregnancy, third trimester: Secondary | ICD-10-CM

## 2017-01-07 DIAGNOSIS — R82998 Other abnormal findings in urine: Secondary | ICD-10-CM

## 2017-01-07 DIAGNOSIS — O0993 Supervision of high risk pregnancy, unspecified, third trimester: Secondary | ICD-10-CM

## 2017-01-07 DIAGNOSIS — O24410 Gestational diabetes mellitus in pregnancy, diet controlled: Secondary | ICD-10-CM

## 2017-01-07 LAB — POCT URINALYSIS DIPSTICK
Bilirubin, UA: NEGATIVE
Blood, UA: NEGATIVE
GLUCOSE UA: NEGATIVE
KETONES UA: NEGATIVE
Nitrite, UA: NEGATIVE
Protein, UA: NEGATIVE
Spec Grav, UA: 1.01 (ref 1.030–1.035)
Urobilinogen, UA: NEGATIVE (ref ?–2.0)
pH, UA: 7.5 (ref 5.0–8.0)

## 2017-01-07 NOTE — Progress Notes (Signed)
ROB- Pt doing well. Reviewed US findings, growth and AFI WNL. All fasting blood sugars 95 or less. Single PP blood sugar greater than 120. Reviewed red flag symptoms and when to call. RTC x 2 weeks for ROB and 36 week cultures.   Jola Babinski, pregnancy care manager, here to meet with patient after office visit.   ULTRASOUND REPORT  Location: [facility] Date of Service: 01/07/17  Indications:Growth and AFI for GDM Findings:  Mason Jim intrauterine pregnancy is visualized with FHR at 162 BPM. Biometrics give an (U/S) Gestational age of 43 6/7 weeks and an (U/S) EDD of 02/12/17; this correlates with the clinically established EDD of 02/17/17.  Fetal presentation is Vertex.  EFW: 2532 oz (5lb 9 oz) Williams 52nd percentile. Placenta: Anterior, Grade 0, remote to cervix. AFI: 16.9 cm.   Impression: 1. 34 6/7 week Viable Singleton Intrauterine pregnancy by U/S. 2. (U/S) EDD is consistent with Clinically established (LMP) EDD of 02/12/17. 3. Adequate growth and AFI

## 2017-01-07 NOTE — Patient Instructions (Signed)
Fetal Movement Counts  Patient Name: ________________________________________________ Patient Due Date: ____________________  What is a fetal movement count?  A fetal movement count is the number of times that you feel your baby move during a certain amount of time. This may also be called a fetal kick count. A fetal movement count is recommended for every pregnant woman. You may be asked to start counting fetal movements as early as week 28 of your pregnancy.  Pay attention to when your baby is most active. You may notice your baby's sleep and wake cycles. You may also notice things that make your baby move more. You should do a fetal movement count:  · When your baby is normally most active.  · At the same time each day.    A good time to count movements is while you are resting, after having something to eat and drink.  How do I count fetal movements?  1. Find a quiet, comfortable area. Sit, or lie down on your side.  2. Write down the date, the start time and stop time, and the number of movements that you felt between those two times. Take this information with you to your health care visits.  3. For 2 hours, count kicks, flutters, swishes, rolls, and jabs. You should feel at least 10 movements during 2 hours.  4. You may stop counting after you have felt 10 movements.  5. If you do not feel 10 movements in 2 hours, have something to eat and drink. Then, keep resting and counting for 1 hour. If you feel at least 4 movements during that hour, you may stop counting.  Contact a health care provider if:  · You feel fewer than 4 movements in 2 hours.  · Your baby is not moving like he or she usually does.  Date: ____________ Start time: ____________ Stop time: ____________ Movements: ____________  Date: ____________ Start time: ____________ Stop time: ____________ Movements: ____________  Date: ____________ Start time: ____________ Stop time: ____________ Movements: ____________  Date: ____________ Start time:  ____________ Stop time: ____________ Movements: ____________  Date: ____________ Start time: ____________ Stop time: ____________ Movements: ____________  Date: ____________ Start time: ____________ Stop time: ____________ Movements: ____________  Date: ____________ Start time: ____________ Stop time: ____________ Movements: ____________  Date: ____________ Start time: ____________ Stop time: ____________ Movements: ____________  Date: ____________ Start time: ____________ Stop time: ____________ Movements: ____________  This information is not intended to replace advice given to you by your health care provider. Make sure you discuss any questions you have with your health care provider.  Document Released: 10/16/2006 Document Revised: 05/15/2016 Document Reviewed: 10/26/2015  Elsevier Interactive Patient Education © 2017 Elsevier Inc.

## 2017-01-10 LAB — URINE CULTURE, OB REFLEX

## 2017-01-10 LAB — CULTURE, OB URINE

## 2017-01-13 ENCOUNTER — Encounter: Payer: Self-pay | Admitting: Certified Nurse Midwife

## 2017-01-13 DIAGNOSIS — O24419 Gestational diabetes mellitus in pregnancy, unspecified control: Secondary | ICD-10-CM | POA: Insufficient documentation

## 2017-01-22 ENCOUNTER — Encounter: Payer: Self-pay | Admitting: Certified Nurse Midwife

## 2017-01-22 ENCOUNTER — Ambulatory Visit (INDEPENDENT_AMBULATORY_CARE_PROVIDER_SITE_OTHER): Payer: Medicaid Other | Admitting: Certified Nurse Midwife

## 2017-01-22 VITALS — BP 112/78 | HR 87 | Wt 173.4 lb

## 2017-01-22 DIAGNOSIS — Z1389 Encounter for screening for other disorder: Secondary | ICD-10-CM

## 2017-01-22 DIAGNOSIS — Z113 Encounter for screening for infections with a predominantly sexual mode of transmission: Secondary | ICD-10-CM

## 2017-01-22 DIAGNOSIS — O0993 Supervision of high risk pregnancy, unspecified, third trimester: Secondary | ICD-10-CM

## 2017-01-22 LAB — POCT URINALYSIS DIPSTICK
Bilirubin, UA: NEGATIVE
Glucose, UA: NEGATIVE
Ketones, UA: NEGATIVE
Leukocytes, UA: NEGATIVE
NITRITE UA: NEGATIVE
Protein, UA: NEGATIVE
RBC UA: NEGATIVE
Spec Grav, UA: 1.01 (ref 1.010–1.025)
UROBILINOGEN UA: NEGATIVE U/dL — AB
pH, UA: 8 (ref 5.0–8.0)

## 2017-01-22 NOTE — Patient Instructions (Signed)

## 2017-01-22 NOTE — Progress Notes (Signed)
ROB, doing well. She is having some braxton hicks contractions. She denies LOF, Vaginal bleeding and painful contractions. Cultures today. ROB in one week. PTL precautions reviewed.  Doreene Burke, CNM

## 2017-01-23 ENCOUNTER — Encounter: Payer: Self-pay | Admitting: Certified Nurse Midwife

## 2017-01-23 NOTE — Telephone Encounter (Signed)
I called pt and she has stated that she was having some spotting yesterday after exam, this stopped. Then she had brown spotting and abdomen tight all day but has stopped.  Baby moving well.  Given labor precautions and encouraged to go to hospital if indicated. Encouraged to contact office tomorrow also if needs to be seen.

## 2017-01-24 ENCOUNTER — Encounter: Payer: Self-pay | Admitting: Certified Nurse Midwife

## 2017-01-24 LAB — STREP GP B NAA: Strep Gp B NAA: NEGATIVE

## 2017-01-25 LAB — GC/CHLAMYDIA PROBE AMP
CHLAMYDIA, DNA PROBE: NEGATIVE
NEISSERIA GONORRHOEAE BY PCR: NEGATIVE

## 2017-01-30 ENCOUNTER — Ambulatory Visit (INDEPENDENT_AMBULATORY_CARE_PROVIDER_SITE_OTHER): Payer: Medicaid Other | Admitting: Certified Nurse Midwife

## 2017-01-30 ENCOUNTER — Encounter: Payer: Self-pay | Admitting: Certified Nurse Midwife

## 2017-01-30 VITALS — BP 127/83 | HR 92 | Wt 175.2 lb

## 2017-01-30 DIAGNOSIS — O0993 Supervision of high risk pregnancy, unspecified, third trimester: Secondary | ICD-10-CM

## 2017-01-30 LAB — POCT URINALYSIS DIPSTICK
BILIRUBIN UA: NEGATIVE
Blood, UA: NEGATIVE
Glucose, UA: NEGATIVE
KETONES UA: NEGATIVE
Nitrite, UA: NEGATIVE
PH UA: 6.5 (ref 5.0–8.0)
PROTEIN UA: NEGATIVE
SPEC GRAV UA: 1.01 (ref 1.010–1.025)
Urobilinogen, UA: 0.2 E.U./dL

## 2017-01-30 NOTE — Progress Notes (Signed)
ROB-Pt doing well, reports irregular contractions. Encouraged home treatment measures. Blood sugars WNL. Her twins are celebrating their 2414th birthday today. Reviewed red flag symptoms and when to call. RTC x 1 week or sooner if needed.

## 2017-02-06 ENCOUNTER — Encounter: Payer: Self-pay | Admitting: Certified Nurse Midwife

## 2017-02-06 ENCOUNTER — Ambulatory Visit (INDEPENDENT_AMBULATORY_CARE_PROVIDER_SITE_OTHER): Payer: Medicaid Other | Admitting: Certified Nurse Midwife

## 2017-02-06 VITALS — BP 122/86 | HR 89 | Wt 176.7 lb

## 2017-02-06 DIAGNOSIS — O0993 Supervision of high risk pregnancy, unspecified, third trimester: Secondary | ICD-10-CM

## 2017-02-06 LAB — POCT URINALYSIS DIPSTICK
Bilirubin, UA: NEGATIVE
Glucose, UA: NEGATIVE
Ketones, UA: NEGATIVE
NITRITE UA: NEGATIVE
PH UA: 7 (ref 5.0–8.0)
PROTEIN UA: NEGATIVE
RBC UA: NEGATIVE
Spec Grav, UA: 1.01 (ref 1.010–1.025)
UROBILINOGEN UA: 0.2 U/dL

## 2017-02-06 NOTE — Progress Notes (Signed)
ROB-Pt doing well, reports occasional BHC. Discussed growth US/BPP at 40 wks and IOL, if needed. Reviewed red flag symptoms and when to call. RTC x 1 week for ROB.

## 2017-02-06 NOTE — Patient Instructions (Signed)
Trial of Labor After Cesarean Delivery A trial of labor after cesarean delivery (TOLAC) is when a woman tries to give birth vaginally after a previous cesarean delivery. TOLAC may be a safe and appropriate option for you depending on your medical history and other risk factors. When TOLAC is successful and you are able to have a vaginal delivery, this is called a vaginal birth after cesarean delivery (VBAC). Candidates for TOLAC TOLAC is possible for some women who:  Have undergone one or two prior cesarean deliveries in which the incision of the uterus was horizontal (low transverse).  Are carrying twins and have had one prior low transverse incision during a cesarean delivery.  Do not have a vertical (classical) uterine scar.  Have not had a tear in the Termini of their uterus (uterine rupture). TOLAC is also supported for women who meet appropriate criteria and:  Are under the age of 40 years.  Are tall and have a body mass index (BMI) of less than 30.  Have an unknown uterine scar.  Give birth in a facility equipped to handle an emergency cesarean delivery. This team should be able to handle possible complications such as a uterine rupture.  Have thorough counseling about the benefits and risks of TOLAC.  Have discussed future pregnancy plans with their health care provider.  Plan to have several more pregnancies. Most successful candidates for TOLAC:  Have had a successful vaginal delivery before or after their cesarean delivery.  Experience labor that begins naturally on or before the due date (40 weeks of gestation).  Do not have a very large (macrosomic) baby.  Had a prior cesarean delivery but are not currently experiencing factors that would prompt a cesarean delivery (such as a breech position).  Had only one prior cesarean delivery.  Had a prior cesarean delivery that was performed early in labor and not after full cervical dilation. TOLAC may be most appropriate for  women who meet the above guidelines and who plan to have more pregnancies. TOLAC is not recommended for home births. Least successful candidates for TOLAC:  Have an induced labor with an unfavorable cervix. An unfavorable cervix is when the cervix is not dilating enough (among other factors).  Have never had a vaginal delivery.  Have had more than two cesarean deliveries.  Have a pregnancy at more than 40 weeks of gestation.  Are pregnant with a baby with a suspected weight greater than 4,000 grams (8 pounds) and who have no prior history of a vaginal delivery.  Have closely spaced pregnancies. Suggested benefits of TOLAC  You may have a faster recovery time.  You may have a shorter stay in the hospital.  You may have less pain and fewer problems than with a cesarean delivery. Women who have a cesarean delivery have a higher chance of needing blood or getting a fever, an infection, or a blood clot in the legs. Suggested risks of TOLAC The highest risk of complications happens to women who attempt a TOLAC and fail. A failed TOLAC results in an unplanned cesarean delivery. Risks related to [facility] or repeat cesarean deliveries include:  Blood loss.  Infection.  Blood clot.  Injury to surrounding tissues or organs.  Having to remove the uterus (hysterectomy).  Potential problems with the placenta (such as placenta previa or placenta accreta) in future pregnancies. Although very rare, the main concerns with TOLAC are:  Rupture of the uterine scar from a past cesarean delivery.  Needing an emergency cesarean delivery.  Having a bad outcome for the baby (perinatal morbidity). Where to find more information:  American Congress of Obstetricians and Gynecologists: www.acog.org  [employer]: www.midwife.org This information is not intended to replace advice given to you by your health care provider. Make sure you discuss any questions you have with your  health care provider. Document Released: 06/04/2011 Document Revised: 08/14/2016 Document Reviewed: 03/08/2013 Elsevier Interactive Patient Education  2017 [employer]

## 2017-02-12 ENCOUNTER — Inpatient Hospital Stay
Admission: EM | Admit: 2017-02-12 | Discharge: 2017-02-14 | DRG: 775 | Disposition: A | Discharge status: Home / Self Care | Payer: Medicaid Other | Attending: Obstetrics and Gynecology | Admitting: Obstetrics and Gynecology

## 2017-02-12 ENCOUNTER — Encounter: Payer: Self-pay | Admitting: *Deleted

## 2017-02-12 DIAGNOSIS — E669 Obesity, unspecified: Secondary | ICD-10-CM | POA: Diagnosis present

## 2017-02-12 DIAGNOSIS — O99214 Obesity complicating childbirth: Secondary | ICD-10-CM | POA: Diagnosis present

## 2017-02-12 DIAGNOSIS — O24420 Gestational diabetes mellitus in childbirth, diet controlled: Principal | ICD-10-CM | POA: Diagnosis present

## 2017-02-12 DIAGNOSIS — Z3A39 39 weeks gestation of pregnancy: Secondary | ICD-10-CM

## 2017-02-12 DIAGNOSIS — Z3493 Encounter for supervision of normal pregnancy, unspecified, third trimester: Secondary | ICD-10-CM | POA: Diagnosis present

## 2017-02-12 DIAGNOSIS — O34211 Maternal care for low transverse scar from previous cesarean delivery: Secondary | ICD-10-CM | POA: Diagnosis present

## 2017-02-12 DIAGNOSIS — Z6830 Body mass index (BMI) 30.0-30.9, adult: Secondary | ICD-10-CM

## 2017-02-12 LAB — CBC
HCT: 37.9 % (ref 35.0–47.0)
Hemoglobin: 12.7 g/dL (ref 12.0–16.0)
MCH: 27.3 pg (ref 26.0–34.0)
MCHC: 33.5 g/dL (ref 32.0–36.0)
MCV: 81.7 fL (ref 80.0–100.0)
Platelets: 276 10*3/uL (ref 150–440)
RBC: 4.65 MIL/uL (ref 3.80–5.20)
RDW: 14.5 % (ref 11.5–14.5)
WBC: 13.2 10*3/uL — ABNORMAL HIGH (ref 3.6–11.0)

## 2017-02-12 LAB — GLUCOSE, CAPILLARY: GLUCOSE-CAPILLARY: 158 mg/dL — AB (ref 65–99)

## 2017-02-12 LAB — GLUCOSE, RANDOM: Glucose, Bld: 101 mg/dL — ABNORMAL HIGH (ref 65–99)

## 2017-02-12 LAB — TYPE AND SCREEN
ABO/RH(D): AB POS
Antibody Screen: NEGATIVE

## 2017-02-12 MED ORDER — OXYTOCIN BOLUS FROM INFUSION
500.0000 mL | Freq: Once | INTRAVENOUS | Status: AC
  Administered 2017-02-12: 500 mL via INTRAVENOUS
  Administered 2017-02-12: 62.5 mL/h via INTRAVENOUS

## 2017-02-12 MED ORDER — ONDANSETRON HCL 4 MG/2ML IJ SOLN: 4.0000 mg | Freq: Four times a day (QID) | INTRAMUSCULAR | Status: DC | PRN

## 2017-02-12 MED ORDER — MISOPROSTOL 200 MCG PO TABS
ORAL_TABLET | ORAL | Status: AC
  Filled 2017-02-12: qty 4

## 2017-02-12 MED ORDER — AMMONIA AROMATIC IN INHA
RESPIRATORY_TRACT | Status: AC
  Filled 2017-02-12: qty 10

## 2017-02-12 MED ORDER — ACETAMINOPHEN 325 MG PO TABS: 650.0000 mg | ORAL_TABLET | ORAL | Status: DC | PRN

## 2017-02-12 MED ORDER — OXYTOCIN 10 UNIT/ML IJ SOLN
INTRAMUSCULAR | Status: AC
  Filled 2017-02-12: qty 2

## 2017-02-12 MED ORDER — LIDOCAINE HCL (PF) 1 % IJ SOLN
INTRAMUSCULAR | Status: AC
  Administered 2017-02-12: 8 mL
  Filled 2017-02-12: qty 30

## 2017-02-12 MED ORDER — SOD CITRATE-CITRIC ACID 500-334 MG/5ML PO SOLN
30.0000 mL | ORAL | Status: DC | PRN
  Filled 2017-02-12: qty 30

## 2017-02-12 MED ORDER — LACTATED RINGERS IV SOLN: 500.0000 mL | INTRAVENOUS | Status: DC | PRN

## 2017-02-12 MED ORDER — BUTORPHANOL TARTRATE 1 MG/ML IJ SOLN: 1.0000 mg | INTRAMUSCULAR | Status: DC | PRN

## 2017-02-12 MED ORDER — LIDOCAINE HCL (PF) 1 % IJ SOLN
INTRAMUSCULAR | Status: AC
  Filled 2017-02-12: qty 30

## 2017-02-12 NOTE — H&P (Signed)
History and Physical   HPI  Alyssa Mcconnell is a 36 y.o. G2P0102 at [redacted]w[redacted]d Estimated Date of Delivery: 02/17/17 who is being admitted for labor management. She is contracting q 3-5 minutes with SROM clear fluid @ 1850.    OB History  Obstetric History   G2   P1   T0   P1   A0   L2    SAB0   TAB0   Ectopic0   Multiple1   Live Births2     # Outcome Date GA Lbr Len/2nd Weight Sex Delivery Anes PTL Lv  2 Current           1A Preterm 01/31/03 [redacted]w[redacted]d  3 lb (1.361 kg) M CS-Unspec  Y LIV  1B Preterm 01/31/03 [redacted]w[redacted]d  4 lb (1.814 kg) M CS-Unspec   LIV    Obstetric Comments  2004 [facility] (emergency C/S) weight 3lbs, and 4lbs.    PROBLEM LIST  Pregnancy complications or risks: Patient Active Problem List   Diagnosis Date Noted  . Gestational diabetes mellitus 01/13/2017  . Overweight (BMI 25.0-29.9) 08/13/2016  . H/O cesarean section complicating pregnancy 08/13/2016  . History of preterm delivery, currently pregnant in second trimester 08/13/2016  . Rubella non-immune status, antepartum 07/09/2016  . Sinus problem 07-15-2016    Prenatal labs and studies: ABO, Rh: AB/Positive/-- 07-16-2023 1030) Antibody: Negative 16-Jul-2023 1030) Rubella: <0.90 07/16/2023 1030) RPR: Non Reactive 07/16/23 1030)  HBsAg: Negative 07-16-23 1030)  HIV: Non Reactive Jul 16, 2023 1030)  GEX:BMWUXLKG (04/25 1151) 1 hr Glucola  149, 3 hr GTT: 86, 203, 164,131 Genetic screening declined Anatomy US normal  Prenatal Transfer Tool  Maternal Diabetes: Yes:  Diabetes Type:  Diet controlled Genetic Screening: Declined Maternal Ultrasounds/Referrals: Normal Fetal Ultrasounds or other Referrals:  None Maternal Substance Abuse:  No Significant Maternal Medications:  None Significant Maternal Lab Results: None   Past Medical History:  Diagnosis Date  . Anemia    during pregnancy  . Gestational diabetes   . History of ovarian cyst      Past Surgical History:  Procedure Laterality Date  . CESAREAN SECTION        Medications    Current Discharge Medication List    CONTINUE these medications which have NOT CHANGED   Details  glucose blood (ACCU-CHEK GUIDE) test strip Use as instructed 4x daily Qty: 100 each, Refills: 12    Prenatal Vit-Fe Fumarate-FA (PRENATAL VITAMINS) 28-0.8 MG TABS Take 1 tablet by mouth daily.     loratadine (CLARITIN) 10 MG tablet Take 10 mg by mouth daily as needed.          Allergies  Patient has no known allergies.  Review of Systems  Pertinent items noted in HPI and remainder of comprehensive ROS otherwise negative.  Physical Exam  BP (!) 144/93 (BP Location: Left Arm)   Temp 98.4 F (36.9 C) (Oral)   Resp 18   Ht 5\' 4"  (1.626 m)   Wt 175 lb (79.4 kg)   LMP 05/13/2016 (Exact Date)   BMI 30.04 kg/m   Lungs:  CTA B Cardio: RRR  Abd: Soft, gravid, NT Presentation: cephalic EXT: No C/C/ 1+ Edema DTRs: 2+ B CERVIX: 5-6 cm per RN exam  See Prenatal records for more detailed PE.     FHR:  Baseline: 125 bpm, Variability: Good {> 6 bpm), Accelerations: Reactive and Decelerations: Absent  Toco: Uterine Contractions: Frequency: Every 2-4 minutes, Duration: 40-60seconds and Intensity: moderate   Test Results Group B Strep negative  Assessment   G2P0102 at 749w2d Estimated Date of Delivery: 02/17/17  The fetus is reassuring. Category 1  Patient Active Problem List   Diagnosis Date Noted  . Gestational diabetes mellitus 01/13/2017  . Overweight (BMI 25.0-29.9) 08/13/2016  . H/O cesarean section complicating pregnancy 08/13/2016  . History of preterm delivery, currently pregnant in second trimester 08/13/2016  . Rubella non-immune status, antepartum 07/09/2016  . Sinus problem 07/08/2016    Plan  1. Admit to L&D for expectant management 2. ZOX:WRUEAVWUEFM:Reactive-- Category 1 Baseline HR:125 3. Epidural if desired. Stadol for IV pain until epidural requested. 4. Admission labs  5. Dr.Evan notified of pt arrival to L&D 6.Anticipate Vaginal  Delivery  Doreene Burkennie Sione Baumgarten, CNM 02/12/2017 8:31 PM

## 2017-02-13 ENCOUNTER — Encounter: Payer: Medicaid Other | Admitting: Certified Nurse Midwife

## 2017-02-13 ENCOUNTER — Encounter: Payer: Self-pay | Admitting: *Deleted

## 2017-02-13 LAB — CBC
HCT: 31.3 % — ABNORMAL LOW (ref 35.0–47.0)
HEMOGLOBIN: 10.7 g/dL — AB (ref 12.0–16.0)
MCH: 27.7 pg (ref 26.0–34.0)
MCHC: 34 g/dL (ref 32.0–36.0)
MCV: 81.5 fL (ref 80.0–100.0)
Platelets: 238 10*3/uL (ref 150–440)
RBC: 3.84 MIL/uL (ref 3.80–5.20)
RDW: 14.7 % — ABNORMAL HIGH (ref 11.5–14.5)
WBC: 20.8 10*3/uL — ABNORMAL HIGH (ref 3.6–11.0)

## 2017-02-13 LAB — GLUCOSE, CAPILLARY: GLUCOSE-CAPILLARY: 89 mg/dL (ref 65–99)

## 2017-02-13 MED ORDER — SENNOSIDES-DOCUSATE SODIUM 8.6-50 MG PO TABS: 2.0000 | ORAL_TABLET | ORAL | Status: DC

## 2017-02-13 MED ORDER — SIMETHICONE 80 MG PO CHEW: 80.0000 mg | CHEWABLE_TABLET | ORAL | Status: DC | PRN

## 2017-02-13 MED ORDER — PRENATAL MULTIVITAMIN CH
1.0000 | ORAL_TABLET | Freq: Every day | ORAL | Status: DC
  Administered 2017-02-13: 1 via ORAL
  Filled 2017-02-13: qty 1

## 2017-02-13 MED ORDER — WITCH HAZEL-GLYCERIN EX PADS: 1.0000 "application " | MEDICATED_PAD | CUTANEOUS | Status: DC | PRN

## 2017-02-13 MED ORDER — TETANUS-DIPHTH-ACELL PERTUSSIS 5-2.5-18.5 LF-MCG/0.5 IM SUSP
0.5000 mL | Freq: Once | INTRAMUSCULAR | Status: AC
  Administered 2017-02-14: 0.5 mL via INTRAMUSCULAR
  Filled 2017-02-13: qty 0.5

## 2017-02-13 MED ORDER — ONDANSETRON HCL 4 MG/2ML IJ SOLN: 4.0000 mg | INTRAMUSCULAR | Status: DC | PRN

## 2017-02-13 MED ORDER — ONDANSETRON HCL 4 MG PO TABS: 4.0000 mg | ORAL_TABLET | ORAL | Status: DC | PRN

## 2017-02-13 MED ORDER — DIBUCAINE 1 % RE OINT: 1.0000 "application " | TOPICAL_OINTMENT | RECTAL | Status: DC | PRN

## 2017-02-13 MED ORDER — OXYCODONE-ACETAMINOPHEN 5-325 MG PO TABS: 1.0000 | ORAL_TABLET | ORAL | Status: DC | PRN

## 2017-02-13 MED ORDER — ACETAMINOPHEN 325 MG PO TABS
650.0000 mg | ORAL_TABLET | ORAL | Status: DC | PRN
  Administered 2017-02-13: 650 mg via ORAL
  Filled 2017-02-13: qty 2

## 2017-02-13 MED ORDER — COCONUT OIL OIL: 1.0000 "application " | TOPICAL_OIL | Status: DC | PRN

## 2017-02-13 MED ORDER — DOCUSATE SODIUM 100 MG PO CAPS
100.0000 mg | ORAL_CAPSULE | Freq: Two times a day (BID) | ORAL | Status: DC
  Administered 2017-02-13 – 2017-02-14 (×3): 100 mg via ORAL
  Filled 2017-02-13 (×3): qty 1

## 2017-02-13 MED ORDER — METHYLERGONOVINE MALEATE 0.2 MG PO TABS: 0.2000 mg | ORAL_TABLET | ORAL | Status: DC | PRN

## 2017-02-13 MED ORDER — FERROUS SULFATE 325 (65 FE) MG PO TABS
325.0000 mg | ORAL_TABLET | Freq: Every day | ORAL | Status: DC
  Administered 2017-02-13 – 2017-02-14 (×2): 325 mg via ORAL
  Filled 2017-02-13 (×2): qty 1

## 2017-02-13 MED ORDER — OXYCODONE-ACETAMINOPHEN 5-325 MG PO TABS: 2.0000 | ORAL_TABLET | ORAL | Status: DC | PRN

## 2017-02-13 MED ORDER — IBUPROFEN 600 MG PO TABS
600.0000 mg | ORAL_TABLET | Freq: Four times a day (QID) | ORAL | Status: DC
  Administered 2017-02-13 – 2017-02-14 (×4): 600 mg via ORAL
  Filled 2017-02-13 (×4): qty 1

## 2017-02-13 MED ORDER — SENNOSIDES-DOCUSATE SODIUM 8.6-50 MG PO TABS
2.0000 | ORAL_TABLET | ORAL | Status: DC
  Administered 2017-02-14: 2 via ORAL
  Filled 2017-02-13: qty 2

## 2017-02-13 MED ORDER — METHYLERGONOVINE MALEATE 0.2 MG/ML IJ SOLN: 0.2000 mg | INTRAMUSCULAR | Status: DC | PRN

## 2017-02-13 MED ORDER — BENZOCAINE-MENTHOL 20-0.5 % EX AERO
1.0000 "application " | INHALATION_SPRAY | CUTANEOUS | Status: DC | PRN
  Administered 2017-02-13: 1 via TOPICAL
  Filled 2017-02-13: qty 56

## 2017-02-13 NOTE — Progress Notes (Signed)
Progress Note - Vaginal Delivery  Alyssa Mcconnell is a 36 y.o. G2P1103 now PP day 1 s/p Vaginal, Spontaneous Delivery .   Subjective:  The patient reports no complaints, up ad lib, voiding, tolerating PO and + flatus  Objective:  Vital signs in last 24 hours: Temp:  [97.4 F (36.3 C)-98.6 F (37 C)] 98.3 F (36.8 C) (05/17 0759) Pulse Rate:  [72-159] 76 (05/17 0759) Resp:  [18-20] 18 (05/17 0759) BP: (112-144)/(57-93) 121/69 (05/17 0759) SpO2:  [98 %] 98 % (05/17 0759) Weight:  [175 lb (79.4 kg)-176 lb (79.8 kg)] 175 lb (79.4 kg) (05/16 1957)  Physical Exam:  General: alert, cooperative, appears stated age and no distress  Lungs clear bilaterally HR: RRR Bowel sounds present  Lochia: appropriate Uterine Fundus: firm @ u-2 DVT Evaluation: No evidence of DVT seen on physical exam. No cords or calf tenderness. No significant calf/ankle edema.    Data Review  Recent Labs  02/12/17 1951 02/13/17 0506  HGB 12.7 10.7*  HCT 37.9 31.3*    Assessment/Plan: Active Problems:   Labor and delivery, indication for care   Plan for discharge tomorrow  -- Continue routine PP care.    Alyssa Mcconnell,CNM 02/13/2017 10:02 AM

## 2017-02-14 LAB — RPR: RPR Ser Ql: NONREACTIVE

## 2017-02-14 MED ORDER — DOCUSATE SODIUM 100 MG PO CAPS: 100.0000 mg | ORAL_CAPSULE | Freq: Two times a day (BID) | ORAL | 0 refills | Status: DC

## 2017-02-14 NOTE — Discharge Summary (Addendum)
                               Discharge Summary  Date of Admission: 02/12/2017  Date of Discharge: 02/14/18  Admitting Diagnosis: Onset of Labor at 3419w2d  Secondary Diagnosis: Gestational diabetes diet controlled (A1)  Mode of Delivery: normal spontaneous vaginal delivery     Discharge Diagnosis: No other diagnosis   Intrapartum Procedures: laceration 2nd   Post partum procedures: None  Complications: 2 degree perineal laceration                      Discharge Day SOAP Note:  Progress Note - Vaginal Delivery  Alyssa Mcconnell is a 36 y.o. [id number] now PP day 2 s/p Vaginal, Spontaneous Delivery . Delivery was uncomplicated  Subjective  The patient has the following complaints: has no unusual complaints  Pain is controlled with current medications.   Patient is urinating without difficulty.  She is ambulating well.    Objective  Vital signs: BP 118/69 (BP Location: Right Arm)   Pulse 88   Temp 98.2 F (36.8 C) (Oral)   Resp 16   Ht 5\' 4"  (1.626 m)   Wt 175 lb (79.4 kg)   LMP 05/13/2016 (Exact Date)   SpO2 100%   Breastfeeding? Unknown   BMI 30.04 kg/m   Physical Exam: Gen: NAD Lungs clear bilaterally HR: RRR Breast: nursing well, no signs of redness, colostrum present Fundus Fundal Tone: Firm  Lochia Amount: Scant  Perineum Appearance: Edematous  Bowel sounds present, passing gas, + BM   Data Review Labs: CBC Latest Ref Rng & Units 02/13/2017 02/12/2017 11/27/2016  WBC 3.6 - 11.0 K/uL 20.8(H) 13.2(H) -  Hemoglobin 12.0 - 16.0 g/dL 10.7(L) 12.7 -  Hematocrit 35.0 - 47.0 % 31.3(L) 37.9 36.4  Platelets 150 - 440 K/uL 238 276 -   AB POS  Assessment/Plan  Active Problems:   Labor and delivery, indication for care    Plan for discharge today.   Discharge Instructions: Per After Visit Summary. Activity: Advance as tolerated. Pelvic rest for 6 weeks.  Also refer to After Visit Summary Diet: Regular Medications: Allergies as of 02/14/2017   No Known  Allergies     Medication List    STOP taking these medications   glucose blood test strip Commonly known as:  ACCU-CHEK GUIDE     TAKE these medications   docusate sodium 100 MG capsule Commonly known as:  COLACE Take 1 capsule (100 mg total) by mouth 2 (two) times daily.   loratadine 10 MG tablet Commonly known as:  CLARITIN Take 10 mg by mouth daily as needed.   Prenatal Vitamins 28-0.8 MG Tabs Take 1 tablet by mouth daily.      Outpatient follow up:  Postpartum contraception: condoms  Discharged Condition: good  Discharged to: home  Newborn Data: Disposition:home with mother  Apgars: APGAR (1 MIN): 8   APGAR (5 MINS): 9   APGAR (10 MINS):    Baby Feeding: Breast    Doreene Burkennie Thompson, CNM 02/14/2017 8:01 AM   I agree with the findings, assessment and plan as noted in this document.  Elonda Huskyavid J. Kahlen Morais, MD 02/14/2017 11:04 AM

## 2017-02-14 NOTE — Final Progress Note (Signed)
Discharge Day SOAP Note:  Progress Note - Vaginal Delivery  Alyssa JulianShana S Mcconnell is a 36 y.o. G2P1103 now PP day 2 s/p Vaginal, Spontaneous Delivery . Delivery was uncomplicated  Subjective  The patient has the following complaints: has no unusual complaints  Pain is controlled with current medications.   Patient is urinating without difficulty.  She is ambulating well.    Objective  Vital signs: BP 118/69 (BP Location: Right Arm)   Pulse 88   Temp 98.2 F (36.8 C) (Oral)   Resp 16   Ht 5\' 4"  (1.626 m)   Wt 175 lb (79.4 kg)   LMP 05/13/2016 (Exact Date)   SpO2 100%   Breastfeeding? Unknown   BMI 30.04 kg/m   Physical Exam: Gen: NAD Lungs clear bilaterally HR: RRR Breast: nursing well, no signs of redness, colostrum present Fundus Fundal Tone: Firm  Lochia Amount: Scant  Perineum Appearance: Edematous  Bowel sounds present, passing gas, + BM   Data Review Labs: CBC Latest Ref Rng & Units 02/13/2017 02/12/2017 11/27/2016  WBC 3.6 - 11.0 K/uL 20.8(H) 13.2(H) -  Hemoglobin 12.0 - 16.0 g/dL 10.7(L) 12.7 -  Hematocrit 35.0 - 47.0 % 31.3(L) 37.9 36.4  Platelets 150 - 440 K/uL 238 276 -   AB POS  Assessment/Plan  Active Problems:   Labor and delivery, indication for care    Plan for discharge today.   Discharge Instructions: Per After Visit Summary. Activity: Advance as tolerated. Pelvic rest for 6 weeks.  Also refer to After Visit Summary Diet: Regular Medications: Allergies as of 02/14/2017   No Known Allergies     Medication List    STOP taking these medications   glucose blood test strip Commonly known as:  ACCU-CHEK GUIDE     TAKE these medications   docusate sodium 100 MG capsule Commonly known as:  COLACE Take 1 capsule (100 mg total) by mouth 2 (two) times daily.   loratadine 10 MG tablet Commonly known as:  CLARITIN Take 10 mg by mouth daily as needed.   Prenatal Vitamins 28-0.8 MG Tabs Take 1 tablet by mouth daily.       Outpatient follow up:  Postpartum contraception: condoms  Discharged Condition: good  Discharged to: home  Newborn Data: Disposition:home with mother  Apgars: APGAR (1 MIN): 8   APGAR (5 MINS): 9   APGAR (10 MINS):    Baby Feeding: Breast    Alyssa Mcconnell, CNM 02/14/2017 8:01 AM

## 2017-02-14 NOTE — Progress Notes (Signed)
Discharge instructions reviewed with patient and significant other.  Education on returning symptoms necessitating return to doctor's office or hospital.  Patient acknowledged understanding.

## 2017-03-25 ENCOUNTER — Encounter: Payer: Self-pay | Admitting: Certified Nurse Midwife

## 2017-03-26 ENCOUNTER — Encounter: Payer: Self-pay | Admitting: Certified Nurse Midwife

## 2017-03-26 ENCOUNTER — Ambulatory Visit (INDEPENDENT_AMBULATORY_CARE_PROVIDER_SITE_OTHER): Payer: Medicaid Other | Admitting: Certified Nurse Midwife

## 2017-03-26 NOTE — Progress Notes (Signed)
Subjective:    Alyssa Mcconnell is a 36 y.o. 622P1103 Caucasian female who presents for a postpartum visit. She is 6 weeks postpartum following a spontaneous vaginal delivery at 39.2 gestational weeks. Anesthesia: local. I have fully reviewed the prenatal and intrapartum course. Postpartum course has been normal. Baby's course has been normal. Baby is feeding by bottle. Bleeding started peroid yesterday. Bowel function is normal. Bladder function is normal. Patient is not sexually active. Last sexual activity: prior to delivery. Contraception method is condoms. Postpartum depression screening: negative. Score 5.  Last pap 2017 and was normal.   The following portions of the patient's history were reviewed and updated as appropriate: allergies, current medications, past medical history, past surgical history and problem list.  Review of Systems Pertinent items are noted in HPI.   Vitals:   03/26/17 0950  BP: (!) 132/95  Pulse: 71  Weight: 161 lb 8 oz (73.3 kg)  Height: 5\' 4"  (1.626 m)   Patient's last menstrual period was 03/24/2017 (exact date).  Objective:   General:  alert, cooperative and no distress   Breasts:  deferred, no complaints  Lungs: clear to auscultation bilaterally  Heart:  regular rate and rhythm  Abdomen: soft, non tender, uterus below symphysis     Vulva: normal  Vagina: normal vagina  Cervix:  Not examined  Corpus:  not examined  Adnexa:  Not examined   Rectal Exam: No external hemorrhoids        Assessment:   Postpartum exam  Bottle feeding Depression screening negative  Contraception counseling   Plan:  : condoms, discussed IUD. Mirena and ParaGard  As well as nexplanon. She is considering an alternative to condoms. Discussed need for pap smear, she started her period yesterday and is having heavy bleeding. Follow up in: 04/25/17 for pap smear and 2 hr GTT or earlier if needed  Doreene BurkeAnnie Sylus Stgermain, CNM

## 2017-03-26 NOTE — Patient Instructions (Signed)

## 2017-03-26 NOTE — Progress Notes (Signed)
Pt is here for a post partum visit. LMP 03/24/17. Bottle feeding. Has not resumed intercourse. Is interested in birth control. Scored 5 on depression screening.

## 2017-04-25 ENCOUNTER — Other Ambulatory Visit: Payer: Self-pay | Admitting: Certified Nurse Midwife

## 2017-04-25 ENCOUNTER — Ambulatory Visit (INDEPENDENT_AMBULATORY_CARE_PROVIDER_SITE_OTHER): Payer: Medicaid Other | Admitting: Certified Nurse Midwife

## 2017-04-25 VITALS — BP 87/67 | HR 85 | Ht 64.0 in | Wt 156.3 lb

## 2017-04-25 DIAGNOSIS — Z124 Encounter for screening for malignant neoplasm of cervix: Secondary | ICD-10-CM | POA: Diagnosis not present

## 2017-04-25 DIAGNOSIS — O24439 Gestational diabetes mellitus in the puerperium, unspecified control: Secondary | ICD-10-CM

## 2017-04-25 NOTE — Patient Instructions (Signed)
Preventive Care 18-39 Years, Female Preventive care refers to lifestyle choices and visits with your health care provider that can promote health and wellness. What does preventive care include?  A yearly physical exam. This is also called an annual well check.  Dental exams once or twice a year.  Routine eye exams. Ask your health care provider how often you should have your eyes checked.  Personal lifestyle choices, including: ? Daily care of your teeth and gums. ? Regular physical activity. ? Eating a healthy diet. ? Avoiding tobacco and drug use. ? Limiting alcohol use. ? Practicing safe sex. ? Taking vitamin and mineral supplements as recommended by your health care provider. What happens during an annual well check? The services and screenings done by your health care provider during your annual well check will depend on your age, overall health, lifestyle risk factors, and family history of disease. Counseling Your health care provider may ask you questions about your:  Alcohol use.  Tobacco use.  Drug use.  Emotional well-being.  Home and relationship well-being.  Sexual activity.  Eating habits.  Work and work Statistician.  Method of birth control.  Menstrual cycle.  Pregnancy history.  Screening You may have the following tests or measurements:  Height, weight, and BMI.  Diabetes screening. This is done by checking your blood sugar (glucose) after you have not eaten for a while (fasting).  Blood pressure.  Lipid and cholesterol levels. These may be checked every 5 years starting at age 66.  Skin check.  Hepatitis C blood test.  Hepatitis B blood test.  Sexually transmitted disease (STD) testing.  BRCA-related cancer screening. This may be done if you have a family history of breast, ovarian, tubal, or peritoneal cancers.  Pelvic exam and Pap test. This may be done every 3 years starting at age 40. Starting at age 59, this may be done every 5  years if you have a Pap test in combination with an HPV test.  Discuss your test results, treatment options, and if necessary, the need for more tests with your health care provider. Vaccines Your health care provider may recommend certain vaccines, such as:  Influenza vaccine. This is recommended every year.  Tetanus, diphtheria, and acellular pertussis (Tdap, Td) vaccine. You may need a Td booster every 10 years.  Varicella vaccine. You may need this if you have not been vaccinated.  HPV vaccine. If you are 69 or younger, you may need three doses over 6 months.  Measles, mumps, and rubella (MMR) vaccine. You may need at least one dose of MMR. You may also need a second dose.  Pneumococcal 13-valent conjugate (PCV13) vaccine. You may need this if you have certain conditions and were not previously vaccinated.  Pneumococcal polysaccharide (PPSV23) vaccine. You may need one or two doses if you smoke cigarettes or if you have certain conditions.  Meningococcal vaccine. One dose is recommended if you are age 27-21 years and a first-year college student living in a residence hall, or if you have one of several medical conditions. You may also need additional booster doses.  Hepatitis A vaccine. You may need this if you have certain conditions or if you travel or work in places where you may be exposed to hepatitis A.  Hepatitis B vaccine. You may need this if you have certain conditions or if you travel or work in places where you may be exposed to hepatitis B.  Haemophilus influenzae type b (Hib) vaccine. You may need this if  you have certain risk factors.  Talk to your health care provider about which screenings and vaccines you need and how often you need them. This information is not intended to replace advice given to you by your health care provider. Make sure you discuss any questions you have with your health care provider. Document Released: 11/12/2001 Document Revised: 06/05/2016  Document Reviewed: 07/18/2015 Elsevier Interactive Patient Education  2017 [employer].

## 2017-04-25 NOTE — Progress Notes (Signed)
GYN ENCOUNTER NOTE  Subjective:       Tamala JulianShana S Venn is a 36 y.o. [id number] female is here for gynecologic evaluation of the following issues:  1. Pap smear and 2 hr Glucola for postpartum evaluation.     Gynecologic History Patient's last menstrual period was 04/10/2017. Contraception: condoms Last Pap: 2015. Results were: normal Last mammogram: N/a.  Obstetric History OB History  Gravida Para Term Preterm AB Living  2 2 1 1   3   SAB TAB Ectopic Multiple Live Births        1 3    # Outcome Date GA Lbr Len/2nd Weight Sex Delivery Anes PTL Lv  2 Term 02/12/17 1937w2d 02:33 / 00:39 8 lb 2.2 oz (3.69 kg) M Vag-Spont None  LIV  1A Preterm 01/31/03 [id number]  3 lb (1.361 kg) M CS-Unspec  Y LIV  1B Preterm 01/31/03 1158w0d  4 lb (1.814 kg) M CS-Unspec   LIV    Obstetric Comments  2004 [facility] (emergency C/S) weight 3lbs, and 4lbs.    Past Medical History:  Diagnosis Date  . Anemia    during pregnancy  . Gestational diabetes   . History of ovarian cyst     Past Surgical History:  Procedure Laterality Date  . CESAREAN SECTION      No current outpatient prescriptions on file prior to visit.   No current facility-administered medications on file prior to visit.     No Known Allergies  Social History   Social History  . Marital status: Married    Spouse name: N/A  . Number of children: N/A  . Years of education: N/A   Occupational History  . Not on file.   Social History Main Topics  . Smoking status: Never Smoker  . Smokeless tobacco: Never Used  . Alcohol use No  . Drug use: No  . Sexual activity: Yes   Other Topics Concern  . Not on file   Social History Narrative  . No narrative on file    Family History  Problem Relation Age of Onset  . Diabetes Father     The following portions of the patient's history were reviewed and updated as appropriate: allergies, current medications, past family history, past medical history, past social history, past surgical  history and problem list.  Review of Systems Review of Systems - Negative except as mentioned in HPI Review of Systems - General ROS: negative for - chills, fatigue, fever, hot flashes, malaise or night sweats Hematological and Lymphatic ROS: negative for - bleeding problems or swollen lymph nodes Gastrointestinal ROS: negative for - abdominal pain, blood in stools, change in bowel habits and nausea/vomiting Musculoskeletal ROS: negative for - joint pain, muscle pain or muscular weakness Genito-Urinary ROS: negative for - change in menstrual cycle, dysmenorrhea, dyspareunia, dysuria, genital discharge, genital ulcers, hematuria, incontinence, irregular/heavy menses, nocturia or pelvic painjj  Objective:   BP (!) 87/67 (BP Location: Left Arm, Patient Position: Sitting, Cuff Size: Normal)   Pulse 85   Ht 5\' 4"  (1.626 m)   Wt 156 lb 4.8 oz (70.9 kg)   LMP 04/10/2017   Breastfeeding? No   BMI 26.83 kg/m  CONSTITUTIONAL: Well-developed, well-nourished female in no acute distress.  HENT:  Normocephalic, atraumatic.  NECK: Normal range of motion,  SKIN: Skin is warm and dry. No rash noted. Not diaphoretic. No erythema. No pallor. NEUROLGIC: Alert and oriented to person, place, and time. PSYCHIATRIC: Normal mood and affect. Normal behavior. Normal judgment and thought  content. CARDIOVASCULAR:Not Examined RESPIRATORY: Not Examined BREASTS: Not Examined ABDOMEN: Soft, non distended; Non tender.  PELVIC:  External Genitalia: Normal  BUS: Normal  Vagina: Normal  Cervix: Normal  Uterus: Normal size, shape,consistency, mobile  Adnexa: Normal  RV: Normal   Bladder: Nontender MUSCULOSKELETAL: Normal range of motion. No tenderness.  No cyanosis, clubbing, or edema.    Assessment:   Screening for cervical cancer DM (diabetes mellitus), gestational, postpartum condition  Plan:   Will follow up with pap and blood work. She continues to use condoms and is satisfied with this method of  birth control. Return 1 yr for annual or prn.   Doreene BurkeAnnie Nolawi Kanady, CNM

## 2017-04-26 LAB — GLUCOSE TOLERANCE, 2 HOURS
Glucose, 2 hour: 166 mg/dL — ABNORMAL HIGH (ref 65–139)
Glucose, GTT - Fasting: 198 mg/dL — ABNORMAL HIGH (ref 65–99)

## 2017-04-29 ENCOUNTER — Encounter: Payer: Self-pay | Admitting: Certified Nurse Midwife

## 2017-04-29 LAB — GLUCOSE TOLERANCE (2 SP BLOOD)
GLUCOSE, 2 HOUR: 166 mg/dL — AB (ref 65–139)
Glucose, 1 Hour GTT: 198 mg/dL (ref 65–199)

## 2017-04-29 LAB — PAP IG AND HPV HIGH-RISK
HPV, high-risk: NEGATIVE
PAP SMEAR COMMENT: 0

## 2017-04-29 LAB — SPECIMEN STATUS REPORT

## 2020-01-25 ENCOUNTER — Encounter: Payer: Self-pay | Admitting: Certified Nurse Midwife

## 2020-02-11 NOTE — Progress Notes (Signed)
Pt present for annual exam.  

## 2020-02-11 NOTE — Patient Instructions (Addendum)
Preventive Care 21-37 Years Old, Female Preventive care refers to visits with your health care provider and lifestyle choices that can promote health and wellness. This includes:  A yearly physical exam. This may also be called an annual well check.  Regular dental visits and eye exams.  Immunizations.  Screening for certain conditions.  Healthy lifestyle choices, such as eating a healthy diet, getting regular exercise, not using drugs or products that contain nicotine and tobacco, and limiting alcohol use. What can I expect for my preventive care visit? Physical exam Your health care provider will check your:  Height and weight. This may be used to calculate body mass index (BMI), which tells if you are at a healthy weight.  Heart rate and blood pressure.  Skin for abnormal spots. Counseling Your health care provider may ask you questions about your:  Alcohol, tobacco, and drug use.  Emotional well-being.  Home and relationship well-being.  Sexual activity.  Eating habits.  Work and work environment.  Method of birth control.  Menstrual cycle.  Pregnancy history. What immunizations do I need?  Influenza (flu) vaccine  This is recommended every year. Tetanus, diphtheria, and pertussis (Tdap) vaccine  You may need a Td booster every 10 years. Varicella (chickenpox) vaccine  You may need this if you have not been vaccinated. Human papillomavirus (HPV) vaccine  If recommended by your health care provider, you may need three doses over 6 months. Measles, mumps, and rubella (MMR) vaccine  You may need at least one dose of MMR. You may also need a second dose. Meningococcal conjugate (MenACWY) vaccine  One dose is recommended if you are age 19-21 years and a first-year college student living in a residence hall, or if you have one of several medical conditions. You may also need additional booster doses. Pneumococcal conjugate (PCV13) vaccine  You may need  this if you have certain conditions and were not previously vaccinated. Pneumococcal polysaccharide (PPSV23) vaccine  You may need one or two doses if you smoke cigarettes or if you have certain conditions. Hepatitis A vaccine  You may need this if you have certain conditions or if you travel or work in places where you may be exposed to hepatitis A. Hepatitis B vaccine  You may need this if you have certain conditions or if you travel or work in places where you may be exposed to hepatitis B. Haemophilus influenzae type b (Hib) vaccine  You may need this if you have certain conditions. You may receive vaccines as individual doses or as more than one vaccine together in one shot (combination vaccines). Talk with your health care provider about the risks and benefits of combination vaccines. What tests do I need?  Blood tests  Lipid and cholesterol levels. These may be checked every 5 years starting at age 20.  Hepatitis C test.  Hepatitis B test. Screening  Diabetes screening. This is done by checking your blood sugar (glucose) after you have not eaten for a while (fasting).  Sexually transmitted disease (STD) testing.  BRCA-related cancer screening. This may be done if you have a family history of breast, ovarian, tubal, or peritoneal cancers.  Pelvic exam and Pap test. This may be done every 3 years starting at age 21. Starting at age 30, this may be done every 5 years if you have a Pap test in combination with an HPV test. Talk with your health care provider about your test results, treatment options, and if necessary, the need for more tests.   Follow these instructions at home: Eating and drinking   Eat a diet that includes fresh fruits and vegetables, whole grains, lean protein, and low-fat dairy.  Take vitamin and mineral supplements as recommended by your health care provider.  Do not drink alcohol if: ? Your health care provider tells you not to drink. ? You are  pregnant, may be pregnant, or are planning to become pregnant.  If you drink alcohol: ? Limit how much you have to 0-1 drink a day. ? Be aware of how much alcohol is in your drink. In the [place] one drink equals one 12 oz bottle of beer (355 mL), one 5 oz glass of wine (148 mL), or one 1 oz glass of hard liquor (44 mL). Lifestyle  Take daily care of your teeth and gums.  Stay active. Exercise for at least 30 minutes on 5 or more days each week.  Do not use any products that contain nicotine or tobacco, such as cigarettes, e-cigarettes, and chewing tobacco. If you need help quitting, ask your health care provider.  If you are sexually active, practice safe sex. Use a condom or other form of birth control (contraception) in order to prevent pregnancy and STIs (sexually transmitted infections). If you plan to become pregnant, see your health care provider for a preconception visit. What's next?  Visit your health care provider once a year for a well check visit.  Ask your health care provider how often you should have your eyes and teeth checked.  Stay up to date on all vaccines. This information is not intended to replace advice given to you by your health care provider. Make sure you discuss any questions you have with your health care provider. Document Revised: 05/28/2018 Document Reviewed: 05/28/2018 Elsevier Patient Education  2020 [employer] Breast self-awareness is knowing how your breasts look and feel. Doing breast self-awareness is important. It allows you to catch a breast problem early while it is still small and can be treated. All women should do breast self-awareness, including women who have had breast implants. Tell your doctor if you notice a change in your breasts. What you need:  A mirror.  A well-lit room. How to do a breast self-exam A breast self-exam is one way to learn what is normal for your breasts and to check for changes. To do a  breast self-exam: Look for changes  1. Take off all the clothes above your waist. 2. Stand in front of a mirror in a room with good lighting. 3. Put your hands on your hips. 4. Push your hands down. 5. Look at your breasts and nipples in the mirror to see if one breast or nipple looks different from the other. Check to see if: ? The shape of one breast is different. ? The size of one breast is different. ? There are wrinkles, dips, and bumps in one breast and not the other. 6. Look at each breast for changes in the skin, such as: ? Redness. ? Scaly areas. 7. Look for changes in your nipples, such as: ? Liquid around the nipples. ? Bleeding. ? Dimpling. ? Redness. ? A change in where the nipples are. Feel for changes  1. Lie on your back on the floor. 2. Feel each breast. To do this, follow these steps: ? Pick a breast to feel. ? Put the arm closest to that breast above your head. ? Use your other arm to feel the nipple area of your breast. Feel  the area with the pads of your three middle fingers by making small circles with your fingers. For the first circle, press lightly. For the second circle, press harder. For the third circle, press even harder. ? Keep making circles with your fingers at the different pressures as you move down your breast. Stop when you feel your ribs. ? Move your fingers a little toward the center of your body. ? Start making circles with your fingers again, this time going up until you reach your collarbone. ? Keep making up-and-down circles until you reach your armpit. Remember to keep using the three pressures. ? Feel the other breast in the same way. 3. Sit or stand in the tub or shower. 4. With soapy water on your skin, feel each breast the same way you did in step 2 when you were lying on the floor. Write down what you find Writing down what you find can help you remember what to tell your doctor. Write down:  What is normal for each breast.  Any  changes you find in each breast, including: ? The kind of changes you find. ? Whether you have pain. ? Size and location of any lumps.  When you last had your menstrual period. General tips  Check your breasts every month.  If you are breastfeeding, the best time to check your breasts is after you feed your baby or after you use a breast pump.  If you get menstrual periods, the best time to check your breasts is 5-7 days after your menstrual period is over.  With time, you will become comfortable with the self-exam, and you will begin to know if there are changes in your breasts. Contact a doctor if you:  See a change in the shape or size of your breasts or nipples.  See a change in the skin of your breast or nipples, such as red or scaly skin.  Have fluid coming from your nipples that is not normal.  Find a lump or thick area that was not there before.  Have pain in your breasts.  Have any concerns about your breast health. Summary  Breast self-awareness includes looking for changes in your breasts, as well as feeling for changes within your breasts.  Breast self-awareness should be done in front of a mirror in a well-lit room.  You should check your breasts every month. If you get menstrual periods, the best time to check your breasts is 5-7 days after your menstrual period is over.  Let your doctor know of any changes you see in your breasts, including changes in size, changes on the skin, pain or tenderness, or fluid from your nipples that is not normal. This information is not intended to replace advice given to you by your health care provider. Make sure you discuss any questions you have with your health care provider. Document Revised: 05/05/2018 Document Reviewed: 05/05/2018 Elsevier Patient Education  [place].  Intrauterine Device Information An intrauterine device (IUD) is a medical device that is inserted in the uterus to prevent pregnancy. It is a  small, T-shaped device that has one or two nylon strings hanging down from it. The strings hang out of the lower part of the uterus (cervix) to allow for future IUD removal. There are two types of IUDs available:  Hormone IUD. This type of IUD is made of plastic and contains the hormone progestin (synthetic progesterone). A hormone IUD may last 3-5 years.  Copper IUD. This type of IUD  has copper wire wrapped around it. A copper IUD may last up to 10 years. How is an IUD inserted? An IUD is inserted through the vagina and placed into the uterus with a minor medical procedure. The exact procedure for IUD insertion may vary among health care providers and hospitals. How does an IUD work? Synthetic progesterone in a hormonal IUD prevents pregnancy by:  Thickening cervical mucus to prevent sperm from entering the uterus.  Thinning the uterine lining to prevent a fertilized egg from being implanted there. Copper in a copper IUD prevents pregnancy by making the uterus and fallopian tubes produce a fluid that kills sperm. What are the advantages of an IUD? Advantages of either type of IUD  It is highly effective in preventing pregnancy.  It is reversible. You can become pregnant shortly after the IUD is removed.  It is low-maintenance and can stay in place for a long time.  There are no estrogen-related side effects.  It can be used when breastfeeding.  It is not associated with weight gain.  It can be inserted right after childbirth, an abortion, or a miscarriage. Advantages of a hormone IUD  If it is inserted within 7 days of your period starting, it works right after it is inserted. If the hormone IUD is inserted at any other time in your cycle, you will need to use a backup method of birth control for 7 days after insertion.  It can make menstrual periods lighter.  It can reduce menstrual cramping.  It can be used for 3-5 years. Advantages of a copper IUD  It works right after it  is inserted.  It can be used as a form of emergency birth control if it is inserted within 5 days after having unprotected sex.  It does not interfere with your body's natural hormones.  It can be used for 10 years. What are the disadvantages of an IUD?  An IUD may cause irregular menstrual bleeding for a period of time after insertion.  You may have pain during insertion and have cramping and vaginal bleeding after insertion.  An IUD may cut the uterus (uterine perforation) when it is inserted. This is rare.  An IUD may cause pelvic inflammatory disease (PID), which is an infection in the uterus and fallopian tubes. This is rare, and it usually happens during the first 20 days after the IUD is inserted.  A copper IUD can make your menstrual flow heavier and more painful. How is an IUD removed?  You will lie on your back with your knees bent and your feet in footrests (stirrups).  A device will be inserted into your vagina to spread apart the vaginal walls (speculum). This will allow your health care provider to see the strings attached to the IUD.  Your health care provider will use a small instrument (forceps) to grasp the IUD strings and pull firmly until the IUD is removed. You may have some discomfort when the IUD is removed. Your health care provider may recommend taking over-the-counter pain relievers, such as ibuprofen, before the procedure. You may also have minor spotting for a few days after the procedure. The exact procedure for IUD removal may vary among health care providers and hospitals. Is the IUD right for me? Your health care provider will make sure you are a good candidate for an IUD and will discuss the advantages, disadvantages, and possible side effects with you. Summary  An intrauterine device (IUD) is a medical device that is inserted  in the uterus to prevent pregnancy. It is a small, T-shaped device that has one or two nylon strings hanging down from it.  A  hormone IUD contains the hormone progestin (synthetic progesterone). A copper IUD has copper wire wrapped around it.  Synthetic progesterone in a hormone IUD prevents pregnancy by thickening cervical mucus and thinning the walls of the uterus. Copper in a copper IUD prevents pregnancy by making the uterus and fallopian tubes produce a fluid that kills sperm.  A hormone IUD can be left in place for 3-5 years. A copper IUD can be left in place for up to 10 years.  An IUD is inserted and removed by a health care provider. You may feel some pain during insertion and removal. Your health care provider may recommend taking over-the-counter pain medicine, such as ibuprofen, before an IUD procedure. This information is not intended to replace advice given to you by your health care provider. Make sure you discuss any questions you have with your health care provider. Document Revised: 08/29/2017 Document Reviewed: 10/15/2016 Elsevier Patient Education  2020 [employer]  

## 2020-02-14 ENCOUNTER — Ambulatory Visit (INDEPENDENT_AMBULATORY_CARE_PROVIDER_SITE_OTHER): Payer: BLUE CROSS/BLUE SHIELD | Admitting: Certified Nurse Midwife

## 2020-02-14 ENCOUNTER — Other Ambulatory Visit: Payer: Self-pay

## 2020-02-14 ENCOUNTER — Encounter: Payer: Self-pay | Admitting: Certified Nurse Midwife

## 2020-02-14 VITALS — BP 129/79 | HR 64 | Ht 64.0 in | Wt 140.2 lb

## 2020-02-14 DIAGNOSIS — Z136 Encounter for screening for cardiovascular disorders: Secondary | ICD-10-CM | POA: Diagnosis not present

## 2020-02-14 DIAGNOSIS — Z13220 Encounter for screening for lipoid disorders: Secondary | ICD-10-CM | POA: Diagnosis not present

## 2020-02-14 DIAGNOSIS — Z01419 Encounter for gynecological examination (general) (routine) without abnormal findings: Secondary | ICD-10-CM

## 2020-02-14 NOTE — Progress Notes (Signed)
GYNECOLOGY ANNUAL PREVENTATIVE CARE ENCOUNTER NOTE  History:     Alyssa Mcconnell is a 39 y.o. G65P1103 female here for a routine annual gynecologic exam.  Current complaints: none.   Denies abnormal vaginal bleeding, discharge, pelvic pain, problems with intercourse or other gynecologic concerns.     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  Exercise: daily  Smoke/Drink?Drugs:Denies    Gynecologic History No LMP recorded. Contraception: condoms, wants to due IUD Last Pap: 04/25/17. Results were: normal with negative HPV Last mammogram: n/a.  Obstetric History OB History  Gravida Para Term Preterm AB Living  2 2 1 1   3   SAB TAB Ectopic Multiple Live Births        1 3    # Outcome Date GA Lbr Len/2nd Weight Sex Delivery Anes PTL Lv  2 Term 02/12/17 [redacted]w[redacted]d 02:33 / 00:39 8 lb 2.2 oz (3.69 kg) M Vag-Spont None  LIV  1A Preterm 01/31/03 [redacted]w[redacted]d  3 lb (1.361 kg) M CS-Unspec  Y LIV  1B Preterm 01/31/03 [redacted]w[redacted]d  4 lb (1.814 kg) M CS-Unspec   LIV    Obstetric Comments  2004 [facility] (emergency C/S) weight 3lbs, and 4lbs.    Past Medical History:  Diagnosis Date  . Anemia    during pregnancy  . Gestational diabetes   . History of ovarian cyst     Past Surgical History:  Procedure Laterality Date  . CESAREAN SECTION      No current outpatient medications on file prior to visit.   No current facility-administered medications on file prior to visit.    No Known Allergies  Social History:  reports that she has never smoked. She has never used smokeless tobacco. She reports that she does not drink alcohol or use drugs.  Family History  Problem Relation Age of Onset  . Diabetes Father     The following portions of the patient's history were reviewed and updated as appropriate: allergies, current medications, past family history, past medical history, past social history, past  surgical history and problem list.  Review of Systems Pertinent items noted in HPI and remainder of comprehensive ROS otherwise negative.  Physical Exam:  There were no vitals taken for this visit. CONSTITUTIONAL: Well-developed, well-nourished female in no acute distress.  HENT:  Normocephalic, atraumatic, External right and left ear normal. Oropharynx is clear and moist EYES: Conjunctivae and EOM are normal. Pupils are equal, round, and reactive to light. No scleral icterus.  NECK: Normal range of motion, supple, no masses.  Normal thyroid.  SKIN: Skin is warm and dry. No rash noted. Not diaphoretic. No erythema. No pallor. MUSCULOSKELETAL: Normal range of motion. No tenderness.  No cyanosis, clubbing, or edema.  2+ distal pulses. NEUROLOGIC: Alert and oriented to person, place, and time. Normal reflexes, muscle tone coordination.  PSYCHIATRIC: Normal mood and affect. Normal behavior. Normal judgment and thought content. CARDIOVASCULAR: Normal heart rate noted, regular rhythm RESPIRATORY: Clear to auscultation bilaterally. Effort and breath sounds normal, no problems with respiration noted. BREASTS: Symmetric in size. No masses, tenderness, skin changes, nipple drainage, or lymphadenopathy bilaterally. Performed in the presence of a chaperone. ABDOMEN: Soft, no distention noted.  No tenderness, rebound or guarding.  PELVIC: Normal appearing external genitalia and urethral meatus; normal appearing vaginal mucosa and cervix.  No abnormal discharge noted.  Pap smear not indicated  Normal uterine size, no other palpable masses,  no uterine or adnexal tenderness.  Performed in the presence of a chaperone.   Assessment and Plan:    1. Encounter for well woman exam with routine gynecological exam  Pap not indicated Mammogram not  Indicated Labs:lipid profile  Refill:none Orders:none Discussed mirena IUD, reviewed procedure. Pt to follow up prn for placement.  Routine preventative health  maintenance measures emphasized. Please refer to After Visit Summary for other counseling recommendations.     Philip Aspen, CNM

## 2020-02-15 LAB — LIPID PANEL
Chol/HDL Ratio: 3 ratio (ref 0.0–4.4)
Cholesterol, Total: 170 mg/dL (ref 100–199)
HDL: 56 mg/dL (ref >39)
LDL Chol Calc (NIH): 103 mg/dL — ABNORMAL HIGH (ref 0–99)
Triglycerides: 56 mg/dL (ref 0–149)
VLDL Cholesterol Cal: 11 mg/dL (ref 5–40)

## 2020-03-01 ENCOUNTER — Other Ambulatory Visit: Payer: Self-pay

## 2020-03-01 ENCOUNTER — Encounter: Payer: Self-pay | Admitting: Certified Nurse Midwife

## 2020-03-01 ENCOUNTER — Ambulatory Visit (INDEPENDENT_AMBULATORY_CARE_PROVIDER_SITE_OTHER): Payer: 59 | Admitting: Certified Nurse Midwife

## 2020-03-01 VITALS — BP 119/78 | HR 71 | Ht 64.0 in | Wt 138.1 lb

## 2020-03-01 DIAGNOSIS — Z30430 Encounter for insertion of intrauterine contraceptive device: Secondary | ICD-10-CM

## 2020-03-01 NOTE — Progress Notes (Signed)
    GYNECOLOGY OFFICE PROCEDURE NOTE  CHANDLER STOFER is a 39 y.o. [id number] here for Mirena IUD insertion. No GYN concerns.  Last pap smear was on 04/25/2017  and was normal.  IUD Insertion Procedure Note Patient identified, informed consent performed, consent signed.   Discussed risks of irregular bleeding, cramping, infection, malpositioning or misplacement of the IUD outside the uterus which may require further procedure such as laparoscopy. Also discussed >99% contraception efficacy, increased risk of ectopic pregnancy with failure of method.  Time out was performed.  Urine pregnancy test negative.  Speculum placed in the vagina.  Cervix visualized.  Cleaned with Betadine x 2.  Grasped anteriorly with a single tooth tenaculum.  Uterus sounded to 8 cm.  Mirena IUD placed per manufacturer's recommendations.  Strings trimmed to 3 cm. Tenaculum was removed, good hemostasis noted.  Patient tolerated procedure well.   Patient was given post-procedure instructions.  She was advised to have backup contraception for one week.  Patient was also asked to check IUD strings periodically and follow up in 4 weeks for IUD check.   Doreene Burke, CNM

## 2020-03-01 NOTE — Patient Instructions (Signed)
Intrauterine Device Insertion, Care After  This sheet gives you information about how to care for yourself after your procedure. Your health care provider may also give you more specific instructions. If you have problems or questions, contact your health care provider. What can I expect after the procedure? After the procedure, it is common to have:  Cramps and pain in the abdomen.  Light bleeding (spotting) or heavier bleeding that is like your menstrual period. This may last for up to a few days.  Lower back pain.  Dizziness.  Headaches.  Nausea. Follow these instructions at home:  Before resuming sexual activity, check to make sure that you can feel the IUD string(s). You should be able to feel the end of the string(s) below the opening of your cervix. If your IUD string is in place, you may resume sexual activity. ? If you had a hormonal IUD inserted more than 7 days after your most recent period started, you will need to use a backup method of birth control for 7 days after IUD insertion. Ask your health care provider whether this applies to you.  Continue to check that the IUD is still in place by feeling for the string(s) after every menstrual period, or once a month.  Take over-the-counter and prescription medicines only as told by your health care provider.  Do not drive or use heavy machinery while taking prescription pain medicine.  Keep all follow-up visits as told by your health care provider. This is important. Contact a health care provider if:  You have bleeding that is heavier or lasts longer than a normal menstrual cycle.  You have a fever.  You have cramps or abdominal pain that get worse or do not get better with medicine.  You develop abdominal pain that is new or is not in the same area of earlier cramping and pain.  You feel lightheaded or weak.  You have abnormal or bad-smelling discharge from your vagina.  You have pain during sexual  activity.  You have any of the following problems with your IUD string(s): ? The string bothers or hurts you or your sexual partner. ? You cannot feel the string. ? The string has gotten longer.  You can feel the IUD in your vagina.  You think you may be pregnant, or you miss your menstrual period.  You think you may have an STI (sexually transmitted infection). Get help right away if:  You have flu-like symptoms.  You have a fever and chills.  You can feel that your IUD has slipped out of place. Summary  After the procedure, it is common to have cramps and pain in the abdomen. It is also common to have light bleeding (spotting) or heavier bleeding that is like your menstrual period.  Continue to check that the IUD is still in place by feeling for the string(s) after every menstrual period, or once a month.  Keep all follow-up visits as told by your health care provider. This is important.  Contact your health care provider if you have problems with your IUD string(s), such as the string getting longer or bothering you or your sexual partner. This information is not intended to replace advice given to you by your health care provider. Make sure you discuss any questions you have with your health care provider. Document Revised: 08/29/2017 Document Reviewed: 08/07/2016 Elsevier Patient Education  2020 Elsevier Inc[employer]  

## 2020-04-05 ENCOUNTER — Encounter: Payer: Self-pay | Admitting: Certified Nurse Midwife

## 2020-04-05 ENCOUNTER — Other Ambulatory Visit: Payer: Self-pay

## 2020-04-05 ENCOUNTER — Ambulatory Visit (INDEPENDENT_AMBULATORY_CARE_PROVIDER_SITE_OTHER): Payer: 59 | Admitting: Certified Nurse Midwife

## 2020-04-05 VITALS — BP 130/73 | HR 73 | Ht 64.0 in | Wt 139.1 lb

## 2020-04-05 DIAGNOSIS — Z30431 Encounter for routine checking of intrauterine contraceptive device: Secondary | ICD-10-CM | POA: Diagnosis not present

## 2020-04-05 NOTE — Patient Instructions (Signed)

## 2020-04-05 NOTE — Progress Notes (Signed)
    GYNECOLOGY OFFICE ENCOUNTER NOTE  History:  39 y.o. G2P1103 here today for today for IUD string check; Mirena  IUD was placed  03/01/20 No complaints about the IUD, no concerning side effects.  The following portions of the patient's history were reviewed and updated as appropriate: allergies, current medications, past family history, past medical history, past social history, past surgical history and problem list. Last pap smear on 04/25/2017 was normal, negative.  Review of Systems:  Pertinent items are noted in HPI.   Objective:  Physical Exam There were no vitals taken for this visit. CONSTITUTIONAL: Well-developed, well-nourished female in no acute distress.  HENT:  Normocephalic, atraumatic. External right and left ear normal. Oropharynx is clear and moist EYES: Conjunctivae and EOM are normal. Pupils are equal, round, and reactive to light. No scleral icterus.  NECK: Normal range of motion, supple, no masses CARDIOVASCULAR: Normal heart rate noted RESPIRATORY: Effort and breath sounds normal, no problems with respiration noted ABDOMEN: Soft, no distention noted.   PELVIC: Normal appearing external genitalia; normal appearing vaginal mucosa and cervix.  IUD strings visualized, about 3 cm in length outside cervix.   Assessment & Plan:  Patient to keep IUD in place for up to five years; can come in for removal if she desires pregnancy earlier or for any concerning side effects.   Doreene Burke, CNM

## 2020-09-20 ENCOUNTER — Encounter: Payer: Self-pay | Admitting: Family Medicine

## 2020-09-20 ENCOUNTER — Ambulatory Visit
Admission: EM | Admit: 2020-09-20 | Discharge: 2020-09-20 | Disposition: A | Discharge status: Home / Self Care | Payer: 59 | Attending: Family Medicine | Admitting: Family Medicine

## 2020-09-20 ENCOUNTER — Other Ambulatory Visit: Payer: Self-pay

## 2020-09-20 DIAGNOSIS — J0100 Acute maxillary sinusitis, unspecified: Secondary | ICD-10-CM

## 2020-09-20 MED ORDER — AMOXICILLIN-POT CLAVULANATE 875-125 MG PO TABS: 1.0000 | ORAL_TABLET | Freq: Two times a day (BID) | ORAL | 0 refills | Status: DC

## 2020-09-20 NOTE — ED Provider Notes (Signed)
Alyssa Mcconnell    CSN: [id number] Arrival date & time: 09/20/20  7371      History   Chief Complaint Chief Complaint  Patient presents with   Facial Pain    HPI Alyssa Mcconnell is a 39 y.o. female.   Patient is a 39 year old female that presents today with sinus pressure, sinus congestion, cough, facial pain, mucus production, cough.  This is been present, not improving over the past 2 weeks.  Has been taking Mucinex, ibuprofen, TheraFlu with mild relief.  No fever, chills, chest pain, shortness of breath.     Past Medical History:  Diagnosis Date   Anemia    during pregnancy   Gestational diabetes    2018   Gestational diabetes 2018   History of ovarian cyst     Patient Active Problem List   Diagnosis Date Noted   Sinus problem 07/08/2016    Past Surgical History:  Procedure Laterality Date   CESAREAN SECTION      OB History    Gravida  2   Para  2   Term  1   Preterm  1   AB      Living  3     SAB      IAB      Ectopic      Multiple  1   Live Births  3        Obstetric Comments  2004 [facility] (emergency C/S) weight 3lbs, and 4lbs.         Home Medications    Prior to Admission medications   Medication Sig Start Date End Date Taking? Authorizing Provider  levonorgestrel (MIRENA) 20 MCG/24HR IUD 1 each by Intrauterine route once.   Yes [provider]  amoxicillin-clavulanate (AUGMENTIN) 875-125 MG tablet Take 1 tablet by mouth every 12 (twelve) hours. 09/20/20   Janace Aris, NP    Family History Family History  Problem Relation Age of Onset   Diabetes Father     Social History Social History   Tobacco Use   Smoking status: Never Smoker   Smokeless tobacco: Never Used  Building services engineer Use: Never used  Substance Use Topics   Alcohol use: No   Drug use: No     Allergies   Patient has no known allergies.   Review of Systems Review of Systems   Physical Exam Triage Vital  Signs ED Triage Vitals [09/20/20 0924]  Enc Vitals Group     BP 135/89     Pulse Rate 85     Resp 16     Temp 98.2 F (36.8 C)     Temp Source Oral     SpO2 97 %     Weight      Height      Head Circumference      Peak Flow      Pain Score      Pain Loc      Pain Edu?      Excl. in GC?    No data found.  Updated Vital Signs BP 135/89 (BP Location: Left Arm)    Pulse 85    Temp 98.2 F (36.8 C) (Oral)    Resp 16    SpO2 97%   Visual Acuity Right Eye Distance:   Left Eye Distance:   Bilateral Distance:    Right Eye Near:   Left Eye Near:    Bilateral Near:     Physical Exam  Vitals and nursing note reviewed.  Constitutional:      General: She is not in acute distress.    Appearance: Normal appearance. She is not ill-appearing, toxic-appearing or diaphoretic.  HENT:     Head: Normocephalic.     Right Ear: Tympanic membrane and ear canal normal.     Left Ear: Tympanic membrane and ear canal normal.     Nose: Congestion present.     Right Sinus: Maxillary sinus tenderness present.     Left Sinus: Maxillary sinus tenderness present.     Mouth/Throat:     Pharynx: Oropharynx is clear.  Eyes:     Conjunctiva/sclera: Conjunctivae normal.  Cardiovascular:     Rate and Rhythm: Normal rate and regular rhythm.  Pulmonary:     Effort: Pulmonary effort is normal.     Breath sounds: Normal breath sounds.  Musculoskeletal:        General: Normal range of motion.     Cervical back: Normal range of motion.  Skin:    General: Skin is warm and dry.     Findings: No rash.  Neurological:     Mental Status: She is alert.  Psychiatric:        Mood and Affect: Mood normal.      UC Treatments / Results  Labs (all labs ordered are listed, but only abnormal results are displayed) Labs Reviewed - No data to display  EKG   Radiology No results found.  Procedures Procedures (including critical care time)  Medications Ordered in UC Medications - No data to  display  Initial Impression / Assessment and Plan / UC Course  I have reviewed the triage vital signs and the nursing notes.  Pertinent labs & imaging results that were available during my care of the patient were reviewed by me and considered in my medical decision making (see chart for details).    Acute sinusitis 2 weeks or more of symptoms. Treating for bacterial sinusitis Treating with Augmentin Continue the over-the-counter medicines as needed Follow up as needed for continued or worsening symptoms    Final Clinical Impressions(s) / UC Diagnoses   Final diagnoses:  Acute non-recurrent maxillary sinusitis     Discharge Instructions     Treating for a sinus infection.  Take medication as prescribed. Over-the-counter medicines  as needed    ED Prescriptions    Medication Sig Dispense Auth. Provider   amoxicillin-clavulanate (AUGMENTIN) 875-125 MG tablet Take 1 tablet by mouth every 12 (twelve) hours. 14 tablet Josimar Corning A, NP     PDMP not reviewed this encounter.   Janace Aris, NP 09/20/20 [id number]

## 2020-09-20 NOTE — Discharge Instructions (Addendum)
Treating for a sinus infection.  Take medication as prescribed. Over-the-counter medicines  as needed

## 2020-09-20 NOTE — ED Triage Notes (Signed)
Pt reports sinus pressure, cough, intermittent nasal congestion x 3 weeks.  Some body aches initially.  Tylenol cold and flu, Mucinex, Ibuprofen, Theraflu with some temporary relief.

## 2022-01-20 ENCOUNTER — Ambulatory Visit
Admission: EM | Admit: 2022-01-20 | Discharge: 2022-01-20 | Disposition: A | Discharge status: Home / Self Care | Payer: 59 | Attending: Emergency Medicine | Admitting: Emergency Medicine

## 2022-01-20 ENCOUNTER — Encounter: Payer: Self-pay | Admitting: Emergency Medicine

## 2022-01-20 DIAGNOSIS — R300 Dysuria: Secondary | ICD-10-CM

## 2022-01-20 LAB — POCT URINALYSIS DIP (MANUAL ENTRY)
Bilirubin, UA: NEGATIVE
Glucose, UA: NEGATIVE mg/dL
Ketones, POC UA: NEGATIVE mg/dL
Nitrite, UA: NEGATIVE
Protein Ur, POC: NEGATIVE mg/dL
Spec Grav, UA: <=1.005 — AB (ref 1.010–1.025)
Urobilinogen, UA: 0.2 E.U./dL
pH, UA: 6.5 (ref 5.0–8.0)

## 2022-01-20 LAB — POCT URINE PREGNANCY: Preg Test, Ur: NEGATIVE

## 2022-01-20 MED ORDER — CEPHALEXIN 500 MG PO CAPS: 500.0000 mg | ORAL_CAPSULE | Freq: Two times a day (BID) | ORAL | 0 refills | Status: AC

## 2022-01-20 MED ORDER — CEPHALEXIN 500 MG PO CAPS: 500.0000 mg | ORAL_CAPSULE | Freq: Two times a day (BID) | ORAL | 0 refills | Status: DC

## 2022-01-20 NOTE — ED Provider Notes (Signed)
?[facility] ? ? ? ?CSN: [id number] ?Arrival date & time: 01/20/22  1322 ? ? ?  ? ?History   ?Chief Complaint ?No chief complaint on file. ? ? ?HPI ?Stefana Lodico Rudge is a 41 y.o. female.  Patient presents with dysuria and urinary frequency since yesterday.  She has been treating her symptoms with increased water intake and cranberry pills.  She denies fever, chills, abdominal pain, hematuria, vaginal discharge, pelvic pain, flank pain, or other symptoms.  She denies current pregnancy or breastfeeding.   ? ? ?The history is provided by the patient.  ? ?Past Medical History:  ?Diagnosis Date  ? Anemia   ? during pregnancy  ? Gestational diabetes   ? 2018  ? Gestational diabetes 2018  ? History of ovarian cyst   ? ? ?Patient Active Problem List  ? Diagnosis Date Noted  ? Sinus problem 07/08/2016  ? ? ?Past Surgical History:  ?Procedure Laterality Date  ? CESAREAN SECTION    ? ? ?OB History   ? ? Gravida  ?2  ? Para  ?2  ? Term  ?1  ? Preterm  ?1  ? AB  ?   ? Living  ?3  ?  ? ? SAB  ?   ? IAB  ?   ? Ectopic  ?   ? Multiple  ?1  ? Live Births  ?3  ?   ?  ? Obstetric Comments  ?2004 [facility] (emergency C/S) weight 3lbs, and 4lbs.  ?  ? ?  ? ? ? ?Home Medications   ? ?Prior to Admission medications   ?Medication Sig Start Date End Date Taking? Authorizing Provider  ?cephALEXin (KEFLEX) 500 MG capsule Take 1 capsule (500 mg total) by mouth 2 (two) times daily for 5 days. 01/20/22 01/25/22  Mickie Bail, NP  ?levonorgestrel (MIRENA) 20 MCG/24HR IUD 1 each by Intrauterine route once.    [provider]  ? ? ?Family History ?Family History  ?Problem Relation Age of Onset  ? Diabetes Father   ? ? ?Social History ?Social History  ? ?Tobacco Use  ? Smoking status: Never  ? Smokeless tobacco: Never  ?Vaping Use  ? Vaping Use: Never used  ?Substance Use Topics  ? Alcohol use: No  ? Drug use: No  ? ? ? ?Allergies   ?Patient has no known allergies. ? ? ?Review of Systems ?Review of Systems  ?Constitutional:  Negative for  chills and fever.  ?Gastrointestinal:  Negative for abdominal pain, constipation, diarrhea, nausea and vomiting.  ?Genitourinary:  Positive for dysuria and frequency. Negative for flank pain, hematuria, pelvic pain and vaginal discharge.  ?Skin:  Negative for color change and rash.  ?All other systems reviewed and are negative. ? ? ?Physical Exam ?Triage Vital Signs ?ED Triage Vitals  ?Enc Vitals Group  ?   BP   ?   Pulse   ?   Resp   ?   Temp   ?   Temp src   ?   SpO2   ?   Weight   ?   Height   ?   Head Circumference   ?   Peak Flow   ?   Pain Score   ?   Pain Loc   ?   Pain Edu?   ?   Excl. in GC?   ? ?No data found. ? ?Updated Vital Signs ?BP 129/82   Pulse 88   Temp 98.2 ?F (36.8 ?C)  Resp 18   SpO2 98%  ? ?Visual Acuity ?Right Eye Distance:   ?Left Eye Distance:   ?Bilateral Distance:   ? ?Right Eye Near:   ?Left Eye Near:    ?Bilateral Near:    ? ?Physical Exam ?Vitals and nursing note reviewed.  ?Constitutional:   ?   General: She is not in acute distress. ?   Appearance: She is well-developed. She is not ill-appearing.  ?HENT:  ?   Mouth/Throat:  ?   Mouth: Mucous membranes are moist.  ?Eyes:  ?   Conjunctiva/sclera: Conjunctivae normal.  ?Cardiovascular:  ?   Rate and Rhythm: Normal rate and regular rhythm.  ?Pulmonary:  ?   Effort: Pulmonary effort is normal. No respiratory distress.  ?   Breath sounds: Normal breath sounds.  ?Abdominal:  ?   General: Bowel sounds are normal.  ?   Palpations: Abdomen is soft.  ?   Tenderness: There is no abdominal tenderness. There is no right CVA tenderness, left CVA tenderness, guarding or rebound.  ?Musculoskeletal:  ?   Cervical back: Neck supple.  ?Skin: ?   General: Skin is warm and dry.  ?Neurological:  ?   Mental Status: She is alert.  ?Psychiatric:     ?   Mood and Affect: Mood normal.     ?   Behavior: Behavior normal.  ? ? ? ?UC Treatments / Results  ?Labs ?(all labs ordered are listed, but only abnormal results are displayed) ?Labs Reviewed  ?POCT  URINALYSIS DIP (MANUAL ENTRY) - Abnormal; Notable for the following components:  ?    Result Value  ? Spec Grav, UA <=1.005 (*)   ? Blood, UA moderate (*)   ? Leukocytes, UA Small (1+) (*)   ? All other components within normal limits  ?URINE CULTURE  ?POCT URINE PREGNANCY  ? ? ?EKG ? ? ?Radiology ?No results found. ? ?Procedures ?Procedures (including critical care time) ? ?Medications Ordered in UC ?Medications - No data to display ? ?Initial Impression / Assessment and Plan / UC Course  ?I have reviewed the triage vital signs and the nursing notes. ? ?Pertinent labs & imaging results that were available during my care of the patient were reviewed by me and considered in my medical decision making (see chart for details). ? ?Dysuria.  Treating with Keflex. Urine culture pending. Discussed with patient that we will call her if the urine culture shows the need to change or discontinue the antibiotic. Instructed her to follow-up with her PCP if her symptoms are not improving. Patient agrees to plan of care.    ? ? ?Final Clinical Impressions(s) / UC Diagnoses  ? ?Final diagnoses:  ?Dysuria  ? ? ? ?Discharge Instructions   ? ?  ?Take the antibiotic as directed.  The urine culture is pending.  We will call you if it shows the need to change or discontinue your antibiotic.   ?Follow up with your primary care provider if your symptoms are not improving.   ? ? ? ? ? ?ED Prescriptions   ? ? Medication Sig Dispense Auth. Provider  ? cephALEXin (KEFLEX) 500 MG capsule  (Status: Discontinued) Take 1 capsule (500 mg total) by mouth 2 (two) times daily for 5 days. 10 capsule Mickie Bailate, Caroleena Paolini H, NP  ? cephALEXin (KEFLEX) 500 MG capsule Take 1 capsule (500 mg total) by mouth 2 (two) times daily for 5 days. 10 capsule Mickie Bailate, Maiyah Goyne H, NP  ? ?  ? ?PDMP not reviewed this  encounter. ?  ?Mickie Bail, NP ?01/20/22 1405 ? ?

## 2022-01-20 NOTE — Discharge Instructions (Addendum)
Take the antibiotic as directed.  The urine culture is pending.  We will call you if it shows the need to change or discontinue your antibiotic.    Follow up with your primary care provider if your symptoms are not improving.    

## 2022-01-21 LAB — URINE CULTURE: Culture: <10000 — AB

## 2022-12-11 ENCOUNTER — Ambulatory Visit
Admission: EM | Admit: 2022-12-11 | Discharge: 2022-12-11 | Disposition: A | Discharge status: Home / Self Care | Payer: 59 | Attending: Emergency Medicine | Admitting: Emergency Medicine

## 2022-12-11 DIAGNOSIS — J209 Acute bronchitis, unspecified: Secondary | ICD-10-CM | POA: Diagnosis not present

## 2022-12-11 DIAGNOSIS — J0100 Acute maxillary sinusitis, unspecified: Secondary | ICD-10-CM

## 2022-12-11 MED ORDER — AMOXICILLIN 875 MG PO TABS: 875.0000 mg | ORAL_TABLET | Freq: Two times a day (BID) | ORAL | 0 refills | Status: AC

## 2022-12-11 NOTE — ED Provider Notes (Signed)
Roderic Palau    CSN: [id number] Arrival date & time: 12/11/22  [id number]      History   Chief Complaint Chief Complaint  Patient presents with   Cough   Nasal Congestion    HPI Alyssa Mcconnell is a 42 y.o. female.  Patient presents with 5-day history of congestion, postnasal drip, runny nose, cough.  She reports wheezing last night.  She had a fever at the onset of her symptoms but none since.  No OTC medications taken today.  Patient denies rash, ear pain, shortness of breath, chest pain, vomiting, diarrhea, or other symptoms.     The history is provided by the patient and medical records.    Past Medical History:  Diagnosis Date   Anemia    during pregnancy   Gestational diabetes    2018   Gestational diabetes 2018   History of ovarian cyst     Patient Active Problem List   Diagnosis Date Noted   Sinus problem 07/08/2016    Past Surgical History:  Procedure Laterality Date   CESAREAN SECTION      OB History     Gravida  2   Para  2   Term  1   Preterm  1   AB      Living  3      SAB      IAB      Ectopic      Multiple  1   Live Births  3        Obstetric Comments  2004 [facility] (emergency C/S) weight 3lbs, and 4lbs.          Home Medications    Prior to Admission medications   Medication Sig Start Date End Date Taking? Authorizing Provider  amoxicillin (AMOXIL) 875 MG tablet Take 1 tablet (875 mg total) by mouth 2 (two) times daily for 10 days. 12/11/22 12/21/22 Yes Sharion Balloon, NP  levonorgestrel (MIRENA) 20 MCG/24HR IUD 1 each by Intrauterine route once.    [provider]    Family History Family History  Problem Relation Age of Onset   Diabetes Father     Social History Social History   Tobacco Use   Smoking status: Never   Smokeless tobacco: Never  Vaping Use   Vaping Use: Never used  Substance Use Topics   Alcohol use: No   Drug use: No     Allergies   Patient has no known  allergies.   Review of Systems Review of Systems  Constitutional:  Negative for chills and fever.  HENT:  Positive for congestion, postnasal drip, rhinorrhea and sinus pressure. Negative for ear pain and sore throat.   Respiratory:  Positive for cough. Negative for shortness of breath.   Cardiovascular:  Negative for chest pain and palpitations.  Gastrointestinal:  Negative for diarrhea and vomiting.  Skin:  Negative for color change and rash.  All other systems reviewed and are negative.    Physical Exam Triage Vital Signs ED Triage Vitals  Enc Vitals Group     BP 12/11/22 0908 137/84     Pulse Rate 12/11/22 0908 62     Resp 12/11/22 0908 15     Temp 12/11/22 0908 97.7 F (36.5 C)     Temp src --      SpO2 12/11/22 0908 97 %     Weight 12/11/22 0908 144 lb (65.3 kg)     Height 12/11/22 0907 '5\' 4"'$  (1.626 m)  Head Circumference --      Peak Flow --      Pain Score 12/11/22 0907 4     Pain Loc --      Pain Edu? --      Excl. in [place]? --    No data found.  Updated Vital Signs BP 137/84   Pulse 62   Temp 97.7 F (36.5 C)   Resp 15   Ht '5\' 4"'$  (1.626 m)   Wt 144 lb (65.3 kg)   SpO2 97%   BMI 24.72 kg/m   Visual Acuity Right Eye Distance:   Left Eye Distance:   Bilateral Distance:    Right Eye Near:   Left Eye Near:    Bilateral Near:     Physical Exam Vitals and nursing note reviewed.  Constitutional:      General: She is not in acute distress.    Appearance: Normal appearance. She is well-developed. She is not ill-appearing.  HENT:     Right Ear: Tympanic membrane normal.     Left Ear: Tympanic membrane normal.     Nose: Congestion and rhinorrhea present.     Mouth/Throat:     Mouth: Mucous membranes are moist.     Pharynx: Oropharynx is clear.  Cardiovascular:     Rate and Rhythm: Normal rate and regular rhythm.     Heart sounds: Normal heart sounds.  Pulmonary:     Effort: Pulmonary effort is normal. No respiratory distress.     Breath sounds:  Normal breath sounds.  Musculoskeletal:     Cervical back: Neck supple.  Skin:    General: Skin is warm and dry.  Neurological:     Mental Status: She is alert.  Psychiatric:        Mood and Affect: Mood normal.        Behavior: Behavior normal.      UC Treatments / Results  Labs (all labs ordered are listed, but only abnormal results are displayed) Labs Reviewed - No data to display  EKG   Radiology No results found.  Procedures Procedures (including critical care time)  Medications Ordered in UC Medications - No data to display  Initial Impression / Assessment and Plan / UC Course  I have reviewed the triage vital signs and the nursing notes.  Pertinent labs & imaging results that were available during my care of the patient were reviewed by me and considered in my medical decision making (see chart for details).   Acute sinusitis, acute bronchitis.  Treating with amoxicillin.  Discussed symptomatic treatment including Tylenol or ibuprofen.  Instructed patient to follow up with her PCP if her symptoms are not improving.  Education provided on sinus infection and bronchitis.  She agrees to plan of care.    Final Clinical Impressions(s) / UC Diagnoses   Final diagnoses:  Acute non-recurrent maxillary sinusitis  Acute bronchitis, unspecified organism     Discharge Instructions      Take the amoxicillin as directed.  Follow up with your primary care provider if your symptoms are not improving.        ED Prescriptions     Medication Sig Dispense Auth. Provider   amoxicillin (AMOXIL) 875 MG tablet Take 1 tablet (875 mg total) by mouth 2 (two) times daily for 10 days. 20 tablet Sharion Balloon, NP      PDMP not reviewed this encounter.   Sharion Balloon, NP 12/11/22 [phone]

## 2022-12-11 NOTE — Discharge Instructions (Addendum)
Take the amoxicillin as directed.  Follow up with your primary care provider if your symptoms are not improving.   ° ° °

## 2022-12-11 NOTE — ED Triage Notes (Signed)
Patient to Urgent Care with complaints of wet and productive cough/ facial pain and pressure. Reports hearing some wheezing last night. Fevers on Friday night.   Symptoms started Friday. Has been taking ibuprofen.

## 2024-01-05 ENCOUNTER — Ambulatory Visit
Admission: RE | Admit: 2024-01-05 | Discharge: 2024-01-05 | Disposition: A | Discharge status: Home / Self Care | Payer: Self-pay | Source: Ambulatory Visit | Attending: Emergency Medicine | Admitting: Emergency Medicine

## 2024-01-05 ENCOUNTER — Other Ambulatory Visit: Payer: Self-pay

## 2024-01-05 VITALS — BP 124/84 | HR 67 | Temp 97.6°F | Resp 18

## 2024-01-05 DIAGNOSIS — H6502 Acute serous otitis media, left ear: Secondary | ICD-10-CM | POA: Diagnosis not present

## 2024-01-05 DIAGNOSIS — J0100 Acute maxillary sinusitis, unspecified: Secondary | ICD-10-CM

## 2024-01-05 MED ORDER — AMOXICILLIN-POT CLAVULANATE 875-125 MG PO TABS: 1.0000 | ORAL_TABLET | Freq: Two times a day (BID) | ORAL | 0 refills | Status: AC

## 2024-01-05 NOTE — ED Provider Notes (Signed)
 Renaldo Fiddler    CSN: [id number] Arrival date & time: 01/05/24  1057      History   Chief Complaint Chief Complaint  Patient presents with   Cough    HPI Alyssa Mcconnell is a 43 y.o. female.   Patient presents for evaluation of nasal congestion, rhinorrhea and a nonproductive cough present for 2 weeks, over the past 5 days has begun to experience sinus pressure to the left cheek, left-sided ear fullness.  At night experiencing ear pain.  Attempted use of ibuprofen and saline rinses.  Past Medical History:  Diagnosis Date   Anemia    during pregnancy   Gestational diabetes    2018   Gestational diabetes 2018   History of ovarian cyst     Patient Active Problem List   Diagnosis Date Noted   Sinus problem 07/08/2016    Past Surgical History:  Procedure Laterality Date   CESAREAN SECTION      OB History     Gravida  2   Para  2   Term  1   Preterm  1   AB      Living  3      SAB      IAB      Ectopic      Multiple  1   Live Births  3        Obstetric Comments  2004 [facility] (emergency C/S) weight 3lbs, and 4lbs.          Home Medications    Prior to Admission medications   Medication Sig Start Date End Date Taking? Authorizing Provider  amoxicillin-clavulanate (AUGMENTIN) 875-125 MG tablet Take 1 tablet by mouth every 12 (twelve) hours for 10 days. 01/05/24 01/15/24 Yes Edna Grover, Elita Boone, NP  levonorgestrel (MIRENA) 20 MCG/24HR IUD 1 each by Intrauterine route once.    [provider]    Family History Family History  Problem Relation Age of Onset   Diabetes Father     Social History Social History   Tobacco Use   Smoking status: Never   Smokeless tobacco: Never  Vaping Use   Vaping status: Never Used  Substance Use Topics   Alcohol use: No   Drug use: No     Allergies   Patient has no known allergies.   Review of Systems Review of Systems   Physical Exam Triage Vital Signs ED Triage Vitals   Encounter Vitals Group     BP 01/05/24 1112 124/84     Systolic BP Percentile --      Diastolic BP Percentile --      Pulse Rate 01/05/24 1112 67     Resp 01/05/24 1112 18     Temp 01/05/24 1112 97.6 F (36.4 C)     Temp Source 01/05/24 1112 Temporal     SpO2 01/05/24 1112 98 %     Weight --      Height --      Head Circumference --      Peak Flow --      Pain Score 01/05/24 1113 0     Pain Loc --      Pain Education --      Exclude from Growth Chart --    No data found.  Updated Vital Signs BP 124/84 (BP Location: Left Arm)   Pulse 67   Temp 97.6 F (36.4 C) (Temporal)   Resp 18   LMP 12/10/2023   SpO2 98%  Visual Acuity Right Eye Distance:   Left Eye Distance:   Bilateral Distance:    Right Eye Near:   Left Eye Near:    Bilateral Near:     Physical Exam Constitutional:      Appearance: Normal appearance.  HENT:     Head: Normocephalic.     Right Ear: Hearing, tympanic membrane, ear canal and external ear normal.     Left Ear: Tympanic membrane is erythematous.     Nose:     Right Sinus: Maxillary sinus tenderness present.     Left Sinus: Maxillary sinus tenderness present.     Mouth/Throat:     Pharynx: No oropharyngeal exudate or posterior oropharyngeal erythema.  Eyes:     Extraocular Movements: Extraocular movements intact.  Cardiovascular:     Rate and Rhythm: Normal rate and regular rhythm.     Pulses: Normal pulses.     Heart sounds: Normal heart sounds.  Pulmonary:     Effort: Pulmonary effort is normal.     Breath sounds: Normal breath sounds.  Musculoskeletal:     Cervical back: Normal range of motion and neck supple.  Neurological:     Mental Status: She is alert and oriented to person, place, and time. Mental status is at baseline.      UC Treatments / Results  Labs (all labs ordered are listed, but only abnormal results are displayed) Labs Reviewed - No data to display  EKG   Radiology No results  found.  Procedures Procedures (including critical care time)  Medications Ordered in UC Medications - No data to display  Initial Impression / Assessment and Plan / UC Course  I have reviewed the triage vital signs and the nursing notes.  Pertinent labs & imaging results that were available during my care of the patient were reviewed by me and considered in my medical decision making (see chart for details).  Acute nonrecurrent maxillary sinusitis, nonrecurrent acute serous otitis media of left ear  Vitals are stable, patient in no signs of distress nontoxic-appearing, presentation and exam is consistent with a sinusitis as well as there is erythema to the left tympanic membrane, will provide bacterial coverage, Augmentin prescribed, declined use of prednisone at this time, recommended additional supportive measures with follow-up as needed Final Clinical Impressions(s) / UC Diagnoses   Final diagnoses:  Acute non-recurrent maxillary sinusitis  Non-recurrent acute serous otitis media of left ear     Discharge Instructions      Today being treated for a sinus infection as well as a left-sided ear infection  Take Augmentin twice daily for 10 days    You can take Tylenol and/or Ibuprofen as needed for fever reduction and pain relief.   For cough: honey 1/2 to 1 teaspoon (you can dilute the honey in water or another fluid).  You can also use guaifenesin and dextromethorphan for cough. You can use a humidifier for chest congestion and cough.  If you don't have a humidifier, you can sit in the bathroom with the hot shower running.      For sore throat: try warm salt water gargles, cepacol lozenges, throat spray, warm tea or water with lemon/honey, popsicles or ice, or OTC cold relief medicine for throat discomfort.   For congestion: take a daily anti-histamine like Zyrtec, Claritin, and a oral decongestant, such as pseudoephedrine.  You can also use Flonase 1-2 sprays in each nostril  daily.   It is important to stay hydrated: drink plenty of fluids (water,  gatorade/powerade/pedialyte, juices, or teas) to keep your throat moisturized and help further relieve irritation/discomfort.    ED Prescriptions     Medication Sig Dispense Auth. Provider   amoxicillin-clavulanate (AUGMENTIN) 875-125 MG tablet Take 1 tablet by mouth every 12 (twelve) hours for 10 days. 20 tablet Valinda Hoar, NP      PDMP not reviewed this encounter.   Valinda Hoar, NP 01/05/24 1201

## 2024-01-05 NOTE — ED Triage Notes (Signed)
 Patient presents to [facility] for evaluation after she and her family had the flu 2 weeks ago.  She has been having persistent nasal congestion and cough, getting better and worse.  The last few days it has been filling up her sinus, left side feels full and pressure. She has been using saline to rinse her sinus with yellow and green thick mucous coming out.  Sometimes when she coughs she feels burning and heaviness in her chest

## 2024-01-05 NOTE — Discharge Instructions (Signed)
 Today being treated for a sinus infection as well as a left-sided ear infection  Take Augmentin twice daily for 10 days    You can take Tylenol and/or Ibuprofen as needed for fever reduction and pain relief.   For cough: honey 1/2 to 1 teaspoon (you can dilute the honey in water or another fluid).  You can also use guaifenesin and dextromethorphan for cough. You can use a humidifier for chest congestion and cough.  If you don't have a humidifier, you can sit in the bathroom with the hot shower running.      For sore throat: try warm salt water gargles, cepacol lozenges, throat spray, warm tea or water with lemon/honey, popsicles or ice, or OTC cold relief medicine for throat discomfort.   For congestion: take a daily anti-histamine like Zyrtec, Claritin, and a oral decongestant, such as pseudoephedrine.  You can also use Flonase 1-2 sprays in each nostril daily.   It is important to stay hydrated: drink plenty of fluids (water, gatorade/powerade/pedialyte, juices, or teas) to keep your throat moisturized and help further relieve irritation/discomfort.
